# Patient Record
Sex: Female | Born: 1945
Health system: Southern US, Community
[De-identification: ages and names within clinical notes are randomized; demographics above are authoritative.]

## PROBLEM LIST (undated history)

## (undated) DIAGNOSIS — I10 Essential (primary) hypertension: Secondary | ICD-10-CM

## (undated) DIAGNOSIS — M810 Age-related osteoporosis without current pathological fracture: Secondary | ICD-10-CM

## (undated) DIAGNOSIS — M069 Rheumatoid arthritis, unspecified: Secondary | ICD-10-CM

## (undated) HISTORY — PX: BREAST SURGERY: SHX581

## (undated) HISTORY — PX: LAPAROSCOPY FOR ECTOPIC PREGNANCY: SUR765

---

## 2019-01-21 ENCOUNTER — Other Ambulatory Visit: Payer: Self-pay

## 2019-01-21 ENCOUNTER — Inpatient Hospital Stay (HOSPITAL_BASED_OUTPATIENT_CLINIC_OR_DEPARTMENT_OTHER)
Admission: EM | Admit: 2019-01-21 | Discharge: 2019-01-26 | DRG: 521 | Disposition: A | Discharge status: Home Health | Payer: Medicare Other | Attending: Internal Medicine | Admitting: Internal Medicine

## 2019-01-21 ENCOUNTER — Emergency Department (HOSPITAL_BASED_OUTPATIENT_CLINIC_OR_DEPARTMENT_OTHER): Payer: Medicare Other

## 2019-01-21 ENCOUNTER — Encounter (HOSPITAL_BASED_OUTPATIENT_CLINIC_OR_DEPARTMENT_OTHER): Payer: Self-pay | Admitting: Emergency Medicine

## 2019-01-21 DIAGNOSIS — Z806 Family history of leukemia: Secondary | ICD-10-CM | POA: Diagnosis not present

## 2019-01-21 DIAGNOSIS — Z87891 Personal history of nicotine dependence: Secondary | ICD-10-CM | POA: Diagnosis not present

## 2019-01-21 DIAGNOSIS — Z978 Presence of other specified devices: Secondary | ICD-10-CM

## 2019-01-21 DIAGNOSIS — K08409 Partial loss of teeth, unspecified cause, unspecified class: Secondary | ICD-10-CM | POA: Diagnosis present

## 2019-01-21 DIAGNOSIS — J9601 Acute respiratory failure with hypoxia: Secondary | ICD-10-CM | POA: Diagnosis not present

## 2019-01-21 DIAGNOSIS — R600 Localized edema: Secondary | ICD-10-CM | POA: Diagnosis present

## 2019-01-21 DIAGNOSIS — Z803 Family history of malignant neoplasm of breast: Secondary | ICD-10-CM

## 2019-01-21 DIAGNOSIS — S72002A Fracture of unspecified part of neck of left femur, initial encounter for closed fracture: Secondary | ICD-10-CM

## 2019-01-21 DIAGNOSIS — E785 Hyperlipidemia, unspecified: Secondary | ICD-10-CM | POA: Diagnosis present

## 2019-01-21 DIAGNOSIS — M810 Age-related osteoporosis without current pathological fracture: Secondary | ICD-10-CM | POA: Diagnosis present

## 2019-01-21 DIAGNOSIS — I10 Essential (primary) hypertension: Secondary | ICD-10-CM | POA: Diagnosis present

## 2019-01-21 DIAGNOSIS — Z853 Personal history of malignant neoplasm of breast: Secondary | ICD-10-CM

## 2019-01-21 DIAGNOSIS — Z20828 Contact with and (suspected) exposure to other viral communicable diseases: Secondary | ICD-10-CM | POA: Diagnosis present

## 2019-01-21 DIAGNOSIS — M069 Rheumatoid arthritis, unspecified: Secondary | ICD-10-CM | POA: Diagnosis present

## 2019-01-21 DIAGNOSIS — K0889 Other specified disorders of teeth and supporting structures: Secondary | ICD-10-CM | POA: Diagnosis present

## 2019-01-21 DIAGNOSIS — M25552 Pain in left hip: Secondary | ICD-10-CM | POA: Diagnosis present

## 2019-01-21 DIAGNOSIS — Z9889 Other specified postprocedural states: Secondary | ICD-10-CM

## 2019-01-21 DIAGNOSIS — M8000XA Age-related osteoporosis with current pathological fracture, unspecified site, initial encounter for fracture: Secondary | ICD-10-CM | POA: Diagnosis not present

## 2019-01-21 DIAGNOSIS — I700 Atherosclerosis of aorta: Secondary | ICD-10-CM | POA: Diagnosis present

## 2019-01-21 DIAGNOSIS — I2699 Other pulmonary embolism without acute cor pulmonale: Secondary | ICD-10-CM | POA: Diagnosis present

## 2019-01-21 DIAGNOSIS — I493 Ventricular premature depolarization: Secondary | ICD-10-CM | POA: Diagnosis present

## 2019-01-21 DIAGNOSIS — D62 Acute posthemorrhagic anemia: Secondary | ICD-10-CM | POA: Diagnosis not present

## 2019-01-21 DIAGNOSIS — B91 Sequelae of poliomyelitis: Secondary | ICD-10-CM | POA: Diagnosis not present

## 2019-01-21 DIAGNOSIS — Z01818 Encounter for other preprocedural examination: Secondary | ICD-10-CM

## 2019-01-21 DIAGNOSIS — W010XXA Fall on same level from slipping, tripping and stumbling without subsequent striking against object, initial encounter: Secondary | ICD-10-CM | POA: Diagnosis present

## 2019-01-21 DIAGNOSIS — M6281 Muscle weakness (generalized): Secondary | ICD-10-CM | POA: Diagnosis present

## 2019-01-21 DIAGNOSIS — R609 Edema, unspecified: Secondary | ICD-10-CM | POA: Diagnosis not present

## 2019-01-21 DIAGNOSIS — S72012A Unspecified intracapsular fracture of left femur, initial encounter for closed fracture: Principal | ICD-10-CM

## 2019-01-21 DIAGNOSIS — E876 Hypokalemia: Secondary | ICD-10-CM | POA: Diagnosis present

## 2019-01-21 DIAGNOSIS — W19XXXA Unspecified fall, initial encounter: Secondary | ICD-10-CM | POA: Diagnosis not present

## 2019-01-21 DIAGNOSIS — I2609 Other pulmonary embolism with acute cor pulmonale: Secondary | ICD-10-CM | POA: Diagnosis not present

## 2019-01-21 DIAGNOSIS — S72009A Fracture of unspecified part of neck of unspecified femur, initial encounter for closed fracture: Secondary | ICD-10-CM | POA: Diagnosis present

## 2019-01-21 DIAGNOSIS — Y92009 Unspecified place in unspecified non-institutional (private) residence as the place of occurrence of the external cause: Secondary | ICD-10-CM | POA: Diagnosis not present

## 2019-01-21 DIAGNOSIS — Z8249 Family history of ischemic heart disease and other diseases of the circulatory system: Secondary | ICD-10-CM

## 2019-01-21 DIAGNOSIS — Z86718 Personal history of other venous thrombosis and embolism: Secondary | ICD-10-CM

## 2019-01-21 HISTORY — DX: Rheumatoid arthritis, unspecified: M06.9

## 2019-01-21 HISTORY — DX: Essential (primary) hypertension: I10

## 2019-01-21 HISTORY — DX: Age-related osteoporosis without current pathological fracture: M81.0

## 2019-01-21 LAB — CBC WITH DIFFERENTIAL/PLATELET
Abs Immature Granulocytes: 0.05 10*3/uL (ref 0.00–0.07)
Basophils Absolute: 0.1 10*3/uL (ref 0.0–0.1)
Basophils Relative: 1 %
Eosinophils Absolute: 0.1 10*3/uL (ref 0.0–0.5)
Eosinophils Relative: 1 %
HCT: 35.6 % — ABNORMAL LOW (ref 36.0–46.0)
Hemoglobin: 12 g/dL (ref 12.0–15.0)
Immature Granulocytes: 1 %
Lymphocytes Relative: 9 %
Lymphs Abs: 0.8 10*3/uL (ref 0.7–4.0)
MCH: 28.4 pg (ref 26.0–34.0)
MCHC: 33.7 g/dL (ref 30.0–36.0)
MCV: 84.2 fL (ref 80.0–100.0)
Monocytes Absolute: 0.8 10*3/uL (ref 0.1–1.0)
Monocytes Relative: 9 %
Neutro Abs: 7.1 10*3/uL (ref 1.7–7.7)
Neutrophils Relative %: 79 %
Platelets: 254 10*3/uL (ref 150–400)
RBC: 4.23 MIL/uL (ref 3.87–5.11)
RDW: 13.2 % (ref 11.5–15.5)
WBC: 8.9 10*3/uL (ref 4.0–10.5)
nRBC: 0 % (ref 0.0–0.2)

## 2019-01-21 LAB — BASIC METABOLIC PANEL
Anion gap: 11 (ref 5–15)
Anion gap: 12 (ref 5–15)
BUN: 24 mg/dL — ABNORMAL HIGH (ref 8–23)
BUN: 20 mg/dL (ref 8–23)
CO2: 28 mmol/L (ref 22–32)
CO2: 27 mmol/L (ref 22–32)
Calcium: 9.2 mg/dL (ref 8.9–10.3)
Calcium: 9 mg/dL (ref 8.9–10.3)
Chloride: 97 mmol/L — ABNORMAL LOW (ref 98–111)
Chloride: 99 mmol/L (ref 98–111)
Creatinine, Ser: 0.78 mg/dL (ref 0.44–1.00)
Creatinine, Ser: 0.7 mg/dL (ref 0.44–1.00)
GFR calc Af Amer: >60 mL/min (ref >60)
GFR calc Af Amer: >60 mL/min (ref >60)
GFR calc non Af Amer: >60 mL/min (ref >60)
GFR calc non Af Amer: >60 mL/min (ref >60)
Glucose, Bld: 102 mg/dL — ABNORMAL HIGH (ref 70–99)
Glucose, Bld: 121 mg/dL — ABNORMAL HIGH (ref 70–99)
Potassium: 2.7 mmol/L — CL (ref 3.5–5.1)
Potassium: 3.3 mmol/L — ABNORMAL LOW (ref 3.5–5.1)
Sodium: 136 mmol/L (ref 135–145)
Sodium: 138 mmol/L (ref 135–145)

## 2019-01-21 LAB — PROTIME-INR
INR: 1 (ref 0.8–1.2)
Prothrombin Time: 12.7 seconds (ref 11.4–15.2)

## 2019-01-21 LAB — MRSA PCR SCREENING: MRSA by PCR: NEGATIVE

## 2019-01-21 LAB — SARS CORONAVIRUS 2 (TAT 6-24 HRS): SARS Coronavirus 2: NEGATIVE

## 2019-01-21 LAB — MAGNESIUM: Magnesium: 1.9 mg/dL (ref 1.7–2.4)

## 2019-01-21 MED ORDER — ONDANSETRON HCL 4 MG/2ML IJ SOLN
4.0000 mg | Freq: Four times a day (QID) | INTRAMUSCULAR | Status: DC | PRN
  Administered 2019-01-21: 4 mg via INTRAVENOUS
  Filled 2019-01-21: qty 2

## 2019-01-21 MED ORDER — CHLORHEXIDINE GLUCONATE 4 % EX LIQD: 60.0000 mL | Freq: Once | CUTANEOUS | Status: DC

## 2019-01-21 MED ORDER — POTASSIUM CHLORIDE CRYS ER 20 MEQ PO TBCR
40.0000 meq | EXTENDED_RELEASE_TABLET | Freq: Once | ORAL | Status: AC
  Administered 2019-01-21: 40 meq via ORAL
  Filled 2019-01-21: qty 2

## 2019-01-21 MED ORDER — METHOCARBAMOL 500 MG PO TABS: 500.0000 mg | ORAL_TABLET | Freq: Four times a day (QID) | ORAL | Status: DC | PRN

## 2019-01-21 MED ORDER — SODIUM CHLORIDE 0.9 % IV SOLN
INTRAVENOUS | Status: DC | PRN
  Administered 2019-01-21: 1000 mL via INTRAVENOUS

## 2019-01-21 MED ORDER — ONDANSETRON HCL 4 MG/2ML IJ SOLN
4.0000 mg | Freq: Once | INTRAMUSCULAR | Status: AC
  Administered 2019-01-21: 4 mg via INTRAVENOUS
  Filled 2019-01-21: qty 2

## 2019-01-21 MED ORDER — HYDROMORPHONE HCL 1 MG/ML IJ SOLN
0.5000 mg | INTRAMUSCULAR | Status: DC | PRN
  Administered 2019-01-21: 0.5 mg via INTRAVENOUS
  Filled 2019-01-21: qty 0.5

## 2019-01-21 MED ORDER — ENSURE PRE-SURGERY PO LIQD
296.0000 mL | Freq: Once | ORAL | Status: AC
  Administered 2019-01-22: 296 mL via ORAL
  Filled 2019-01-21: qty 296

## 2019-01-21 MED ORDER — POVIDONE-IODINE 10 % EX SWAB: 2.0000 "application " | Freq: Once | CUTANEOUS | Status: DC

## 2019-01-21 MED ORDER — CEFAZOLIN SODIUM-DEXTROSE 2-4 GM/100ML-% IV SOLN
2.0000 g | INTRAVENOUS | Status: AC
  Administered 2019-01-22: 11:00:00 2 g via INTRAVENOUS

## 2019-01-21 MED ORDER — LISINOPRIL 20 MG PO TABS: 20.0000 mg | ORAL_TABLET | Freq: Every day | ORAL | Status: DC

## 2019-01-21 MED ORDER — HYDROMORPHONE HCL 1 MG/ML IJ SOLN: 0.5000 mg | INTRAMUSCULAR | Status: DC | PRN

## 2019-01-21 MED ORDER — TRAMADOL HCL 50 MG PO TABS
50.0000 mg | ORAL_TABLET | Freq: Four times a day (QID) | ORAL | Status: DC | PRN
  Administered 2019-01-22: 50 mg via ORAL
  Filled 2019-01-21: qty 1

## 2019-01-21 MED ORDER — HYDROCODONE-ACETAMINOPHEN 5-325 MG PO TABS: 1.0000 | ORAL_TABLET | Freq: Four times a day (QID) | ORAL | Status: DC | PRN

## 2019-01-21 MED ORDER — ONDANSETRON HCL 4 MG PO TABS: 4.0000 mg | ORAL_TABLET | Freq: Four times a day (QID) | ORAL | Status: DC | PRN

## 2019-01-21 MED ORDER — POTASSIUM CHLORIDE 10 MEQ/100ML IV SOLN
10.0000 meq | INTRAVENOUS | Status: AC
  Administered 2019-01-21 (×2): 10 meq via INTRAVENOUS
  Filled 2019-01-21 (×2): qty 100

## 2019-01-21 MED ORDER — METHOCARBAMOL 1000 MG/10ML IJ SOLN: 500.0000 mg | Freq: Four times a day (QID) | INTRAVENOUS | Status: DC | PRN

## 2019-01-21 MED ORDER — HYDROCODONE-ACETAMINOPHEN 5-325 MG PO TABS
1.0000 | ORAL_TABLET | Freq: Once | ORAL | Status: AC
  Administered 2019-01-21: 1 via ORAL
  Filled 2019-01-21: qty 1

## 2019-01-21 MED ORDER — POTASSIUM CHLORIDE CRYS ER 20 MEQ PO TBCR
60.0000 meq | EXTENDED_RELEASE_TABLET | Freq: Once | ORAL | Status: AC
  Administered 2019-01-21: 60 meq via ORAL
  Filled 2019-01-21: qty 3

## 2019-01-21 NOTE — ED Notes (Signed)
[place], PA made aware of pt's O2 sats (see VS record). 2L [place] applied

## 2019-01-21 NOTE — ED Notes (Signed)
Spoke with Catherine Wong, consult for ortho

## 2019-01-21 NOTE — ED Provider Notes (Signed)
[facility] EMERGENCY DEPARTMENT Provider Note   CSN: [id number] Arrival date & time: 01/21/19  1131     History   Chief Complaint Chief Complaint  Patient presents with  . Fall    HPI Catherine Wong is a 73 y.o. female.     Patient presents the emergency department with ongoing left-sided hip pain.  Patient has a history of poliomyelitis and has had paralysis since age 89.  She is typically able to ambulate with crutches and a brace on her left leg.  She has had a history of knee surgery and now has a rod in her right leg.  She states that she fell about 5 days ago when she lost her balance.  She states that she was very tired and just fell over.  She denies hitting her head.  She has had significant pain with movement over the past 5 days and has been using a wheelchair.  She denies other injuries or pains elsewhere.  She has been taking over-the-counter medication without improvement.  She presents today with persistent pain.     Past Medical History:  Diagnosis Date  . Hypertension   . Osteoporosis   . Rheumatoid arthritis (HCC)     There are no active problems to display for this patient.   The histories are not reviewed yet. Please review them in the "History" navigator section and refresh this SmartLink.   OB History   No obstetric history on file.      Home Medications    Prior to Admission medications   Not on File    Family History History reviewed. No pertinent family history.  Social History Social History   Tobacco Use  . Smoking status: Current Every Day Smoker  Substance Use Topics  . Alcohol use: Never    Frequency: Never  . Drug use: Never     Allergies   Patient has no allergy information on record.   Review of Systems Review of Systems  Constitutional: Negative for fever and unexpected weight change.  HENT: Negative for facial swelling, rhinorrhea, sore throat and trouble swallowing.   Eyes: Negative for redness.   Respiratory: Negative for cough, shortness of breath, wheezing and stridor.   Cardiovascular: Negative for chest pain.  Gastrointestinal: Negative for abdominal pain, constipation, diarrhea, nausea and vomiting.       Negative for fecal incontinence.   Genitourinary: Negative for dysuria, flank pain, hematuria, pelvic pain, vaginal bleeding and vaginal discharge.       Negative for urinary incontinence or retention.  Musculoskeletal: Positive for arthralgias, back pain, gait problem and myalgias.  Skin: Negative for rash.  Neurological: Negative for weakness, light-headedness, numbness and headaches.       Denies saddle paresthesias.  Psychiatric/Behavioral: Negative for confusion.     Physical Exam Updated Vital Signs BP (!) 165/85 (BP Location: Right Arm)   Pulse 80   Temp 98.2 F (36.8 C) (Oral)   Resp 18   Ht 5\' 3"  (1.6 m)   Wt 78 kg   SpO2 98%   BMI 30.46 kg/m   Physical Exam Vitals signs and nursing note reviewed.  Constitutional:      Appearance: She is well-developed.  HENT:     Head: Normocephalic and atraumatic.  Eyes:     General:        Right eye: No discharge.        Left eye: No discharge.     Conjunctiva/sclera: Conjunctivae normal.  Neck:  Musculoskeletal: Normal range of motion and neck supple.  Cardiovascular:     Rate and Rhythm: Normal rate and regular rhythm.     Pulses:          Radial pulses are 2+ on the right side and 2+ on the left side.       Dorsalis pedis pulses are 2+ on the right side and 2+ on the left side.     Heart sounds: Normal heart sounds.  Pulmonary:     Effort: Pulmonary effort is normal.     Breath sounds: Normal breath sounds.  Abdominal:     Palpations: Abdomen is soft.     Tenderness: There is no abdominal tenderness.  Musculoskeletal:     Left hip: She exhibits tenderness.     Cervical back: Normal.     Thoracic back: Normal.     Lumbar back: Normal.     Comments: Patient with brace on left leg.  She does not  have sensation.  Patient complains of pain, worse with palpation and movement over the lateral left hip that radiates to the groin area.  Patient winces with palpation in these areas.  Skin:    General: Skin is warm and dry.  Neurological:     Mental Status: She is alert.      ED Treatments / Results  Labs (all labs ordered are listed, but only abnormal results are displayed) Labs Reviewed  BASIC METABOLIC PANEL - Abnormal; Notable for the following components:      Result Value   Potassium 2.7 (*)    Chloride 97 (*)    Glucose, Bld 102 (*)    BUN 24 (*)    All other components within normal limits  CBC WITH DIFFERENTIAL/PLATELET - Abnormal; Notable for the following components:   HCT 35.6 (*)    All other components within normal limits  SARS CORONAVIRUS 2 (TAT 6-24 HRS)  PROTIME-INR  TYPE AND SCREEN    EKG EKG Interpretation  Date/Time:  Saturday January 21 2019 12:50:23 EST Ventricular Rate:  67 PR Interval:    QRS Duration: 99 QT Interval:  412 QTC Calculation: 435 R Axis:   -35 Text Interpretation: Sinus rhythm Ventricular premature complex Left axis deviation Borderline low voltage, extremity leads Baseline wander in lead(s) V2 No STEMI Confirmed by Alvester Chou [phone]) on 01/21/2019 12:55:57 PM   Radiology Dg Hip Unilat W Or Wo Pelvis 2-3 Views Left  Result Date: 01/21/2019 CLINICAL DATA:  Left hip pain, status post fall EXAM: DG HIP (WITH OR WITHOUT PELVIS) 2-3V LEFT COMPARISON:  None. FINDINGS: Nondisplaced subcapital left hip fracture. Status post ORIF of the right hip. Visualized bony pelvis appears intact. Degenerative changes of the lower lumbar spine. IMPRESSION: Nondisplaced subcapital left hip fracture. Electronically Signed   By: Charline Bills M.D.   On: 01/21/2019 12:18    Procedures Procedures (including critical care time)  Medications Ordered in ED Medications  HYDROmorphone (DILAUDID) injection 0.5 mg (has no administration in time  range)  potassium chloride SA (KLOR-CON) CR tablet 60 mEq (has no administration in time range)  potassium chloride 10 mEq in 100 mL IVPB (has no administration in time range)  HYDROcodone-acetaminophen (NORCO/VICODIN) 5-325 MG per tablet 1 tablet (1 tablet Oral Given 01/21/19 1217)  ondansetron (ZOFRAN) injection 4 mg (4 mg Intravenous Given 01/21/19 1300)     Initial Impression / Assessment and Plan / ED Course  I have reviewed the triage vital signs and the nursing notes.  Pertinent labs &  imaging results that were available during my care of the patient were reviewed by me and considered in my medical decision making (see chart for details).  Clinical Course as of Jan 20 1238  Sat Jan 21, 2019  1231 Patient seen by myself as well as PA provider.  Briefly this is a 73 year old female with a history of polio as a child, with chronic left-sided leg paralysis using a hip brace, presenting to the ED with a mechanical fall.  Patient was found to have a left subcapital nondisplaced hip fracture.  She has minimal pain but has minimal sensation on the left side at baseline.  She has no strength in her left leg.  She is otherwise well-appearing and has no complaints my evaluation.  She is pending admission labs, will reach out to orthopedics as well.   [MT]    Clinical Course User Index [MT] Trifan, Carola Rhine, MD       Patient seen and examined. Medications ordered.   Vital signs reviewed and are as follows: BP (!) 165/85 (BP Location: Right Arm)   Pulse 80   Temp 98.2 F (36.8 C) (Oral)   Resp 18   Ht 5\' 3"  (1.6 m)   Wt 78 kg   SpO2 98%   BMI 30.46 kg/m   12:43 PM patient updated on results.  I reviewed x-rays.  Patient with left-sided subcapital nondisplaced hip fracture that is closed.  Lower extremity is vascularly intact.  Patient discussed with and seen by Dr. Langston Masker. Patient aware of need for admission.  We will touch base with orthopedic surgery.  1:19 PM Awaiting remainder of  labs. Spoke with Dr. Lorin Mercy of orthopedic surgery. Plan fixation tomorrow.   1:58 PM I spoke with Dr. Zigmund Daniel of [facility] who agrees to admission.   BMP has returned with K 2.7. Oral K+ and two runs IV K+ ordered to replete.   RN states that O2 level upper 80's after Vicodin ordered. She was placed on 2L [place]. Will continue to monitor.   Final Clinical Impressions(s) / ED Diagnoses   Final diagnoses:  Closed subcapital fracture of left femur, initial encounter ([place])  Hypokalemia   Hip fx. K+ being repleted. Needs admit.   ED Discharge Orders    None       Carlisle Cater, PA-C 01/21/19 1400    Wyvonnia Dusky, MD 01/22/19 [phone]

## 2019-01-21 NOTE — ED Notes (Signed)
ED Provider at bedside. 

## 2019-01-21 NOTE — ED Notes (Signed)
Pt given PB and graham crackers and apple juice

## 2019-01-21 NOTE — ED Notes (Signed)
Pt's son took her clothing and leg brace

## 2019-01-21 NOTE — ED Notes (Signed)
Pt may eat per [place], PA

## 2019-01-21 NOTE — Anesthesia Preprocedure Evaluation (Addendum)
Anesthesia Evaluation  Patient identified by MRN, date of birth, ID band Patient awake    Reviewed: Allergy & Precautions, NPO status , Patient's Chart, lab work & pertinent test results  History of Anesthesia Complications Negative for: history of anesthetic complications  Airway Mallampati: II  TM Distance: >3 FB Neck ROM: Full    Dental  (+) Edentulous Upper, Missing,    Pulmonary Current Smoker,    Pulmonary exam normal        Cardiovascular hypertension, Pt. on medications Normal cardiovascular exam     Neuro/Psych poliomyelitis as child with left leg chronic weakness negative psych ROS   GI/Hepatic negative GI ROS, Neg liver ROS,   Endo/Other  negative endocrine ROS  Renal/GU negative Renal ROS  negative genitourinary   Musculoskeletal  (+) Arthritis , Rheumatoid disorders,    Abdominal   Peds  Hematology negative hematology ROS (+)   Anesthesia Other Findings Day of surgery medications reviewed with patient.  Reproductive/Obstetrics negative OB ROS                           Anesthesia Physical Anesthesia Plan  ASA: II  Anesthesia Plan: General   Post-op Pain Management:    Induction: Intravenous  PONV Risk Score and Plan: 4 or greater and Treatment may vary due to age or medical condition, Ondansetron and Dexamethasone  Airway Management Planned: Oral ETT  Additional Equipment: None  Intra-op Plan:   Post-operative Plan: Extubation in OR  Informed Consent: I have reviewed the patients History and Physical, chart, labs and discussed the procedure including the risks, benefits and alternatives for the proposed anesthesia with the patient or authorized representative who has indicated his/her understanding and acceptance.     Dental advisory given  Plan Discussed with: CRNA  Anesthesia Plan Comments: (Offered spinal vs GETA. Pt would like to proceed with GETA.  Daiva Huge, MD)      Anesthesia Quick Evaluation

## 2019-01-21 NOTE — Plan of Care (Signed)
73 year old female coming from [place] with history of polio status post fall on her left hip and fractured left hip.  Dr. Lorin Mercy has been consulted and he is going to take her to the OR tomorrow.  Patient will be n.p.o. after midnight.  Lovenox will be started after surgery for DVT prophylaxis.

## 2019-01-21 NOTE — ED Triage Notes (Signed)
Pt c/o left side hip pain r/t fall x 5 days ago. Pt denies loc, reports landing on hip. Pt reports hx of polio. Pt states 800 mg ibuprofen at 0600 today.

## 2019-01-21 NOTE — H&P (Signed)
Catherine SeedsKathleen Wong ZOX:[id number] DOB: 12/17/1945 DOA: 01/21/2019     PCP: Patient, No Pcp Per   Outpatient Specialists:   none  Patient arrived to ER on 01/21/19 at 1131  Patient coming from: home Lives  With family    Chief Complaint:  Chief Complaint  Patient presents with   Fall    HPI: Catherine Wong is a 73 y.o. female with medical history significant of poliomyelitis as child with left leg chronic weakness, hypertension osteoporosis rheumatoid arthritis, hx of breast cancer    Presented with  Fall 5 days ago while in [place] with  left side hip pain  No LOC, hx of polio as a child.  She has been walking with a brace ever since she was 73 years old. Reports a mechanical fall when she lost her balance.  Denies hitting her head.  Since that she has been having significant pain and been using wheelchair.  No other pain anywhere else.  She has been trying to use ibuprofen to help with her pain but it persisted so [facility].  Patient recently moved from [place] with her family for the past 5 days They traveled by car  Denis any hx of CAD She denies shortness of breath or chest pain with walking  she quit smoking 2 years   Infectious risk factors:  Reports N/V  No fever or chills She has been isolating       in house  PCR testing  Pending  No results found for: SARSCOV2NAA   Regarding pertinent Chronic problems: Department   Hyperlipidemia - on statins simvastatin not compliant   HTN on lisinopril, HCTZ    Breast cancer in Left breast in remission  19 years ago  RA - only on ibuprofen   While in ER: X-ray showed subcapital nondisplaced hip fracture Was found to have potassium of 2.7 Her potassium was replaced and her pain was treated with IV Dilaudid  ER Provider Called: Orthopedic surgery   Dr.Yates They Recommend admit to medicine plan to repair hip in a.m. Will see in AM requesting repeat keep patient n.p.o. post  midnight Plan for Lovenox to be started after surgery for DVT prophylaxis  While in emergency department patient became somewhat hypoxic after Vicodin was administered and was started on 2 L  The following Work up has been ordered so far:  Orders Placed This Encounter  Procedures   MRSA PCR Screening   DG Hip Unilat W or Wo Pelvis 2-3 Views Left   DG Chest Port 1 View   Basic metabolic panel   CBC WITH DIFFERENTIAL   Protime-INR   Diet regular Room service appropriate? Yes; Fluid consistency: Thin   Diet NPO time specified   Check CMS   Bed rest   Initiate Carrier Fluid Protocol   Vital signs   Notify physician   Initiate Oral Care Protocol   Initiate Carrier Fluid Protocol   RN may order General Admission PRN Orders utilizing "General Admission PRN medications" (through manage orders) for the following patient needs: allergy symptoms (Claritin), cold sores (Carmex), cough (Robitussin DM), eye irritation (Liquifilm Tears), hemorrhoids (Tucks), indigestion (Maalox), minor skin irritation (Hydrocortisone Cream), muscle pain Romeo Apple(Ben Gay), nose irritation (saline nasal spray) and sore throat (Chloraseptic spray).   SCDs   Full code   Consult to orthopedic surgery  ALL PATIENTS BEING ADMITTED/HAVING PROCEDURES NEED COVID-19 SCREENING   Inpatient consult to Social Work   Consult to care management  ED EKG   EKG 12-Lead   Admit to Inpatient (patient's expected length of stay will be greater than 2 midnights or inpatient only procedure)    Following Medications were ordered in ER: Medications  HYDROmorphone (DILAUDID) injection 0.5 mg (0.5 mg Intravenous Given 01/21/19 1651)  0.9 %  sodium chloride infusion ( Intravenous Stopped 01/21/19 1730)  ondansetron (ZOFRAN) injection 4 mg (4 mg Intravenous Given 01/21/19 1804)    Or  ondansetron (ZOFRAN) tablet 4 mg ( Oral See Alternative 01/21/19 1804)  HYDROcodone-acetaminophen (NORCO/VICODIN) 5-325 MG per tablet 1 tablet  (1 tablet Oral Given 01/21/19 1217)  ondansetron (ZOFRAN) injection 4 mg (4 mg Intravenous Given 01/21/19 1300)  potassium chloride SA (KLOR-CON) CR tablet 60 mEq (60 mEq Oral Given 01/21/19 1453)  potassium chloride 10 mEq in 100 mL IVPB (10 mEq Intravenous Other (enter comment in med admin window) 01/21/19 1728)        Consult Orders  (From admission, onward)         Start     Ordered   01/21/19 1827  Inpatient consult to Social Work  Once    Comments: Possible with admission  Provider:  (Not yet assigned)  Question:  Reason for Consult:  Answer:  Skilled nursing facility placement   01/21/19 1827   01/21/19 1827  Consult to care management  Once    Provider:  (Not yet assigned)  Question Answer Comment  Reason for consult: Home health needs   Reason for consult: Other (see comments)      01/21/19 1827           Significant initial  Findings: Abnormal Labs Reviewed  BASIC METABOLIC PANEL - Abnormal; Notable for the following components:      Result Value   Potassium 2.7 (*)    Chloride 97 (*)    Glucose, Bld 102 (*)    BUN 24 (*)    All other components within normal limits  CBC WITH DIFFERENTIAL/PLATELET - Abnormal; Notable for the following components:   HCT 35.6 (*)    All other components within normal limits    Otherwise labs showing:    Recent Labs  Lab 01/21/19 1246  NA 136  K 2.7*  CO2 28  GLUCOSE 102*  BUN 24*  CREATININE 0.78  CALCIUM 9.2    Cr   Stable  Lab Results  Component Value Date   CREATININE 0.78 01/21/2019    No results for input(s): AST, ALT, ALKPHOS, BILITOT, PROT, ALBUMIN in the last 168 hours. Lab Results  Component Value Date   CALCIUM 9.2 01/21/2019     WBC      Component Value Date/Time   WBC 8.9 01/21/2019 1246   ANC    Component Value Date/Time   NEUTROABS 7.1 01/21/2019 1246   ALC No components found for: LYMPHAB    Plt: Lab Results  Component Value Date   PLT 254 01/21/2019     COVID-19 Labs  No  results for input(s): DDIMER, FERRITIN, LDH, CRP in the last 72 hours.  No results found for: SARSCOV2NAA     HG/HCT  Stable     Component Value Date/Time   HGB 12.0 01/21/2019 1246   HCT 35.6 (L) 01/21/2019 1246       ECG: Ordered Personally reviewed by me showing: HR : 67 Rhythm:  NSR,     no evidence of ischemic changes QTC 435    UA   not ordered      Ordered  CXR -  NON acute  LEft hip  Nondisplaced subcapital left hip fracture.    ED Triage Vitals  Enc Vitals Group     BP 01/21/19 1145 (!) 165/85     Pulse Rate 01/21/19 1145 80     Resp 01/21/19 1145 18     Temp 01/21/19 1145 98.2 F (36.8 C)     Temp Source 01/21/19 1145 Oral     SpO2 01/21/19 1145 98 %     Weight 01/21/19 1146 171 lb 15.3 oz (78 kg)     Height 01/21/19 1146 5\' 3"  (1.6 m)     Head Circumference --      Peak Flow --      Pain Score 01/21/19 1146 10     Pain Loc --      Pain Edu? --      Excl. in GC? --   TMAX(24)@       Latest  Blood pressure 119/66, pulse (!) 47, temperature 97.7 F (36.5 C), temperature source Oral, resp. rate 16, height 5\' 3"  (1.6 m), weight 73.9 kg, SpO2 94 %.     Hospitalist was called for admission for hip fracture   Review of Systems:    Pertinent positives include:  nausea,  Constitutional:  No weight loss, night sweats, Fevers, chills, fatigue, weight loss  HEENT:  No headaches, Difficulty swallowing,Tooth/dental problems,Sore throat,  No sneezing, itching, ear ache, nasal congestion, post nasal drip,  Cardio-vascular:  No chest pain, Orthopnea, PND, anasarca, dizziness, palpitations.no Bilateral lower extremity swelling  GI:  No heartburn, indigestion, abdominal pain, vomiting, diarrhea, change in bowel habits, loss of appetite, melena, blood in stool, hematemesis Resp:  no shortness of breath at rest. No dyspnea on exertion, No excess mucus, no productive cough, No non-productive cough, No coughing up of blood.No change in color of mucus. No  wheezing. Skin:  no rash or lesions. No jaundice GU:  no dysuria, change in color of urine, no urgency or frequency. No straining to urinate.  No flank pain.  Musculoskeletal:  No joint pain or no joint swelling. No decreased range of motion. No back pain.  Psych:  No change in mood or affect. No depression or anxiety. No memory loss.  Neuro: no localizing neurological complaints, no tingling, no weakness, no double vision, no gait abnormality, no slurred speech, no confusion  All systems reviewed and apart from HOPI all are negative  Past Medical History:   Past Medical History:  Diagnosis Date   Hypertension    Osteoporosis    Rheumatoid arthritis (HCC)       Past Surgical History:  Procedure Laterality Date   BREAST SURGERY     LAPAROSCOPY FOR ECTOPIC PREGNANCY      Social History:  Ambulatory  With crutches     reports that she has been smoking. She does not have any smokeless tobacco history on file. She reports that she does not drink alcohol or use drugs.   Family History:   Family History  Problem Relation Age of Onset   Breast cancer Mother    CAD Mother    Leukemia Other     Allergies: No Known Allergies   Prior to Admission medications   Medication Sig Start Date End Date Taking? Authorizing Provider  hydrochlorothiazide (HYDRODIURIL) 25 MG tablet Take 25 mg by mouth daily.   Yes [provider]  ibuprofen (ADVIL) 200 MG tablet Take 600 mg by mouth every 6 (six) hours as needed for moderate pain.  Yes [provider]  lisinopril (ZESTRIL) 20 MG tablet Take 20 mg by mouth daily.   Yes [provider]  traMADol (ULTRAM) 50 MG tablet Take 50 mg by mouth every 6 (six) hours as needed for moderate pain.   Yes [provider]   Physical Exam: Blood pressure 119/66, pulse (!) 47, temperature 97.7 F (36.5 C), temperature source Oral, resp. rate 16, height 5\' 3"  (1.6 m), weight 73.9 kg, SpO2 94 %. 1. General:   in No  Acute distress    Chronically ill  -appearing 2. Psychological: Alert and  Oriented 3. Head/ENT:    Dry Mucous Membranes                          Head Non traumatic, neck supple                            Poor Dentition 4. SKIN:   decreased Skin turgor,  Skin clean Dry and intact no rash 5. Heart: Regular rate and rhythm no  Murmur, no Rub or gallop 6. Lungs:  no wheezes or crackles   7. Abdomen: Soft,  non-tender, Non distended  bowel sounds present 8. Lower extremities: no clubbing, cyanosis,  Edema on left foot chronic 9. Neurologically left leg weakness chronic  10. MSK: Normal range of motion   All other LABS:     Recent Labs  Lab 01/21/19 1246  WBC 8.9  NEUTROABS 7.1  HGB 12.0  HCT 35.6*  MCV 84.2  PLT 254     Recent Labs  Lab 01/21/19 1246  NA 136  K 2.7*  CL 97*  CO2 28  GLUCOSE 102*  BUN 24*  CREATININE 0.78  CALCIUM 9.2     No results for input(s): AST, ALT, ALKPHOS, BILITOT, PROT, ALBUMIN in the last 168 hours.     Cultures: No results found for: SDES, SPECREQUEST, CULT, REPTSTATUS   Radiological Exams on Admission: Dg Chest Port 1 View  Result Date: 01/21/2019 CLINICAL DATA:  Preop exam EXAM: PORTABLE CHEST 1 VIEW COMPARISON:  None FINDINGS: The heart size and mediastinal contours are within normal limits. Both lungs are clear. The visualized skeletal structures are unremarkable. IMPRESSION: No acute cardiopulmonary disease. Electronically Signed   By: 14/06/2018 M.D.   On: 01/21/2019 12:42   Dg Hip Unilat W Or Wo Pelvis 2-3 Views Left  Result Date: 01/21/2019 CLINICAL DATA:  Left hip pain, status post fall EXAM: DG HIP (WITH OR WITHOUT PELVIS) 2-3V LEFT COMPARISON:  None. FINDINGS: Nondisplaced subcapital left hip fracture. Status post ORIF of the right hip. Visualized bony pelvis appears intact. Degenerative changes of the lower lumbar spine. IMPRESSION: Nondisplaced subcapital left hip fracture. Electronically Signed   By: 14/06/2018 M.D.   On: 01/21/2019 12:18    Chart has been reviewed    Assessment/Plan  73 y.o. female with medical history significant of poliomyelitis as child with left leg chronic weakness, hypertension osteoporosis rheumatoid arthritis, hx of breast cancer     Admitted for left hip fracture   Present on Admission:    Hip fracture, left closed, initial encounter (HCC) -   management as per orthopedics,  plan to operate  in  a.m.    Keep nothing by mouth post midnight. Patient   not on anticoagulation or antiplatelet agents   Ordered type and screen, Place [place], order a vitamin D level  Patient  at baseline  able to walk a flight of stairs or 100 feet     Patient denies any chest pain or shortness of breath currently and/or with exertion,   ECG showing no evidence of acute ischemia  no known history of coronary artery disease, COPD  Liver failure CKD  Given advanced age patient is at least moderate  Risk  at this point no furthther cardiac workup is indicated.     Essential hypertension - chornic continue home meds Hold HCTZ for now   Osteoporosis - check vit D level   Hypokalemia - - will replace and repeat in AM,  check magnesium level and replace as needed. K now up to 3.3  Sp Polio with chronic left leg weakness and edema -would need to see assessment to discharge as patient may have additional challenges with relation. Leg edema although patient states this is chronic she has had recent extensive travel.  Obtain Dopplers of extremity   Other plan as per orders.  DVT prophylaxis:  SCD    Code Status:  FULL CODE  as per patient   I had personally discussed CODE STATUS with patient    Family Communication:   Family not at  Bedside  plan of care was discussed on the phone    Disposition Plan:    likely will need placement for rehabilitation                                         Would benefit from PT/OT eval prior to DC  pls order as per orthopedic when  cleared                                      Social Work  consulted                                       Consults called: Orthopedics Dr. Lorin Mercy is aware  Admission status:  ED Disposition    ED Disposition Condition [place]: [facility] [[id number]]  Level of Care: Med-Surg [16]  Covid Evaluation: Asymptomatic Screening Protocol (No Symptoms)  Diagnosis: [facility] [[id number]]  Admitting Physician: Georgette Shell [[id number]]  Attending Physician: Georgette Shell [[id number]]  Estimated length of stay: 3 - 4 days  Certification:: I certify this patient will need inpatient services for at least 2 midnights  PT Class (Do Not Modify): Inpatient [101]  PT Acc Code (Do Not Modify): Private [1]         inpatient     Expect 2 midnight stay secondary to severity of patient's current illness including     Severe lab/radiological/exam abnormalities including:  Left hip fracture   and extensive comorbidities including: polio hx of breast cancer    That are currently affecting medical management.   I expect  patient to be hospitalized for 2 midnights requiring inpatient medical care.  Patient is at high risk for adverse outcome (such as loss of life or disability) if not treated.  Indication for inpatient stay as follows:    severe pain requiring acute inpatient management,  inability to maintain oral hydration    Need for operative/procedural  intervention  Need for   IV fluids,      Level of care       medical floor      Precautions: admitted as  asymptomatic screening protocol  No active isolations    PPE: Used by the provider:   P100  eye Goggles,  Gloves      Krislynn Gronau 01/21/2019, 9:50 PM    [facility]     after 2 AM please page floor coverage PA If 7AM-7PM, please contact the day team taking care of the patient using [email or web address]

## 2019-01-21 NOTE — ED Notes (Signed)
Patient transported to X-ray 

## 2019-01-21 NOTE — Progress Notes (Signed)
Pt stable at this being medicated for nausea.

## 2019-01-21 NOTE — Plan of Care (Signed)
  Problem: Clinical Measurements: Goal: Respiratory complications will improve Outcome: Progressing   Problem: Clinical Measurements: Goal: Cardiovascular complication will be avoided Outcome: Progressing   Problem: Activity: Goal: Risk for activity intolerance will decrease Outcome: Progressing   Problem: Coping: Goal: Level of anxiety will decrease Outcome: Progressing   Problem: Pain Managment: Goal: General experience of comfort will improve Outcome: Progressing   Problem: Safety: Goal: Ability to remain free from injury will improve Outcome: Progressing   

## 2019-01-22 ENCOUNTER — Encounter (HOSPITAL_COMMUNITY)
Admission: EM | Disposition: A | Discharge status: Home Health | Payer: Self-pay | Source: Home / Self Care | Attending: Internal Medicine

## 2019-01-22 ENCOUNTER — Inpatient Hospital Stay (HOSPITAL_COMMUNITY): Payer: Medicare Other | Admitting: Anesthesiology

## 2019-01-22 ENCOUNTER — Encounter (HOSPITAL_COMMUNITY): Payer: Self-pay | Admitting: Certified Registered Nurse Anesthetist

## 2019-01-22 ENCOUNTER — Inpatient Hospital Stay (HOSPITAL_COMMUNITY): Payer: Medicare Other

## 2019-01-22 DIAGNOSIS — S72012A Unspecified intracapsular fracture of left femur, initial encounter for closed fracture: Secondary | ICD-10-CM | POA: Diagnosis not present

## 2019-01-22 DIAGNOSIS — R609 Edema, unspecified: Secondary | ICD-10-CM | POA: Diagnosis not present

## 2019-01-22 HISTORY — PX: HIP ARTHROPLASTY: SHX981

## 2019-01-22 LAB — BASIC METABOLIC PANEL
Anion gap: 10 (ref 5–15)
BUN: 19 mg/dL (ref 8–23)
CO2: 28 mmol/L (ref 22–32)
Calcium: 9.1 mg/dL (ref 8.9–10.3)
Chloride: 101 mmol/L (ref 98–111)
Creatinine, Ser: 0.69 mg/dL (ref 0.44–1.00)
GFR calc Af Amer: >60 mL/min (ref >60)
GFR calc non Af Amer: >60 mL/min (ref >60)
Glucose, Bld: 156 mg/dL — ABNORMAL HIGH (ref 70–99)
Potassium: 3.8 mmol/L (ref 3.5–5.1)
Sodium: 139 mmol/L (ref 135–145)

## 2019-01-22 LAB — CBC
HCT: 34.4 % — ABNORMAL LOW (ref 36.0–46.0)
Hemoglobin: 11.2 g/dL — ABNORMAL LOW (ref 12.0–15.0)
MCH: 28.5 pg (ref 26.0–34.0)
MCHC: 32.6 g/dL (ref 30.0–36.0)
MCV: 87.5 fL (ref 80.0–100.0)
Platelets: 240 10*3/uL (ref 150–400)
RBC: 3.93 MIL/uL (ref 3.87–5.11)
RDW: 13.2 % (ref 11.5–15.5)
WBC: 6.3 10*3/uL (ref 4.0–10.5)
nRBC: 0 % (ref 0.0–0.2)

## 2019-01-22 LAB — PREPARE RBC (CROSSMATCH)

## 2019-01-22 LAB — VITAMIN D 25 HYDROXY (VIT D DEFICIENCY, FRACTURES): Vit D, 25-Hydroxy: 34.94 ng/mL (ref 30–100)

## 2019-01-22 LAB — ALBUMIN: Albumin: 3.5 g/dL (ref 3.5–5.0)

## 2019-01-22 SURGERY — HEMIARTHROPLASTY, HIP, DIRECT ANTERIOR APPROACH, FOR FRACTURE
Anesthesia: General | Site: Hip | Laterality: Left

## 2019-01-22 MED ORDER — PHENYLEPHRINE 40 MCG/ML (10ML) SYRINGE FOR IV PUSH (FOR BLOOD PRESSURE SUPPORT)
PREFILLED_SYRINGE | INTRAVENOUS | Status: DC | PRN
  Administered 2019-01-22: 160 ug via INTRAVENOUS

## 2019-01-22 MED ORDER — FENTANYL CITRATE (PF) 100 MCG/2ML IJ SOLN
INTRAMUSCULAR | Status: AC
  Filled 2019-01-22: qty 2

## 2019-01-22 MED ORDER — DEXAMETHASONE SODIUM PHOSPHATE 10 MG/ML IJ SOLN
INTRAMUSCULAR | Status: AC
  Filled 2019-01-22: qty 1

## 2019-01-22 MED ORDER — LIDOCAINE 2% (20 MG/ML) 5 ML SYRINGE
INTRAMUSCULAR | Status: DC | PRN
  Administered 2019-01-22 (×2): 40 mg via INTRAVENOUS

## 2019-01-22 MED ORDER — ONDANSETRON HCL 4 MG PO TABS: 4.0000 mg | ORAL_TABLET | Freq: Four times a day (QID) | ORAL | Status: DC | PRN

## 2019-01-22 MED ORDER — ONDANSETRON HCL 4 MG/2ML IJ SOLN
INTRAMUSCULAR | Status: AC
  Filled 2019-01-22: qty 4

## 2019-01-22 MED ORDER — ACETAMINOPHEN 500 MG PO TABS
ORAL_TABLET | ORAL | Status: AC
  Administered 2019-01-22: 1000 mg
  Filled 2019-01-22: qty 2

## 2019-01-22 MED ORDER — ONDANSETRON HCL 4 MG/2ML IJ SOLN
INTRAMUSCULAR | Status: DC | PRN
  Administered 2019-01-22: 4 mg via INTRAVENOUS

## 2019-01-22 MED ORDER — BUPIVACAINE HCL (PF) 0.5 % IJ SOLN
INTRAMUSCULAR | Status: AC
  Filled 2019-01-22: qty 30

## 2019-01-22 MED ORDER — PHENYLEPHRINE 40 MCG/ML (10ML) SYRINGE FOR IV PUSH (FOR BLOOD PRESSURE SUPPORT)
PREFILLED_SYRINGE | INTRAVENOUS | Status: AC
  Filled 2019-01-22: qty 30

## 2019-01-22 MED ORDER — METHOCARBAMOL 500 MG IVPB - SIMPLE MED
INTRAVENOUS | Status: AC
  Filled 2019-01-22: qty 50

## 2019-01-22 MED ORDER — OXYCODONE HCL 5 MG PO TABS: 5.0000 mg | ORAL_TABLET | Freq: Once | ORAL | Status: DC | PRN

## 2019-01-22 MED ORDER — ACETAMINOPHEN 325 MG PO TABS: 325.0000 mg | ORAL_TABLET | Freq: Four times a day (QID) | ORAL | Status: DC | PRN

## 2019-01-22 MED ORDER — STERILE WATER FOR IRRIGATION IR SOLN
Status: DC | PRN
  Administered 2019-01-22: 2000 mL

## 2019-01-22 MED ORDER — HYDROCODONE-ACETAMINOPHEN 5-325 MG PO TABS
1.0000 | ORAL_TABLET | ORAL | Status: DC | PRN
  Administered 2019-01-22: 1 via ORAL
  Administered 2019-01-23: 2 via ORAL
  Administered 2019-01-23 – 2019-01-24 (×4): 1 via ORAL
  Filled 2019-01-22 (×4): qty 1
  Filled 2019-01-22: qty 2
  Filled 2019-01-22: qty 1

## 2019-01-22 MED ORDER — FENTANYL CITRATE (PF) 100 MCG/2ML IJ SOLN
25.0000 ug | INTRAMUSCULAR | Status: DC | PRN
  Administered 2019-01-22: 25 ug via INTRAVENOUS

## 2019-01-22 MED ORDER — ACETAMINOPHEN 10 MG/ML IV SOLN: 1000.0000 mg | Freq: Once | INTRAVENOUS | Status: DC | PRN

## 2019-01-22 MED ORDER — ACETAMINOPHEN 500 MG PO TABS
1000.0000 mg | ORAL_TABLET | Freq: Once | ORAL | Status: DC
  Filled 2019-01-22: qty 2

## 2019-01-22 MED ORDER — SUGAMMADEX SODIUM 500 MG/5ML IV SOLN
INTRAVENOUS | Status: AC
  Filled 2019-01-22: qty 5

## 2019-01-22 MED ORDER — SUCCINYLCHOLINE CHLORIDE 200 MG/10ML IV SOSY
PREFILLED_SYRINGE | INTRAVENOUS | Status: AC
  Filled 2019-01-22: qty 10

## 2019-01-22 MED ORDER — CEFAZOLIN SODIUM-DEXTROSE 2-4 GM/100ML-% IV SOLN
INTRAVENOUS | Status: AC
  Filled 2019-01-22: qty 100

## 2019-01-22 MED ORDER — PHENYLEPHRINE HCL (PRESSORS) 10 MG/ML IV SOLN
INTRAVENOUS | Status: AC
  Filled 2019-01-22: qty 1

## 2019-01-22 MED ORDER — PHENOL 1.4 % MT LIQD: 1.0000 | OROMUCOSAL | Status: DC | PRN

## 2019-01-22 MED ORDER — DOCUSATE SODIUM 100 MG PO CAPS
100.0000 mg | ORAL_CAPSULE | Freq: Two times a day (BID) | ORAL | Status: DC
  Administered 2019-01-23 – 2019-01-26 (×7): 100 mg via ORAL
  Filled 2019-01-22 (×8): qty 1

## 2019-01-22 MED ORDER — METHOCARBAMOL 500 MG IVPB - SIMPLE MED
500.0000 mg | Freq: Four times a day (QID) | INTRAVENOUS | Status: DC | PRN
  Administered 2019-01-22: 500 mg via INTRAVENOUS
  Filled 2019-01-22: qty 50

## 2019-01-22 MED ORDER — ASPIRIN EC 325 MG PO TBEC
325.0000 mg | DELAYED_RELEASE_TABLET | Freq: Every day | ORAL | Status: DC
  Administered 2019-01-23 – 2019-01-24 (×2): 325 mg via ORAL
  Filled 2019-01-22 (×2): qty 1

## 2019-01-22 MED ORDER — ALBUMIN HUMAN 5 % IV SOLN
INTRAVENOUS | Status: AC
  Filled 2019-01-22: qty 250

## 2019-01-22 MED ORDER — 0.9 % SODIUM CHLORIDE (POUR BTL) OPTIME
TOPICAL | Status: DC | PRN
  Administered 2019-01-22: 1000 mL

## 2019-01-22 MED ORDER — LIDOCAINE 2% (20 MG/ML) 5 ML SYRINGE
INTRAMUSCULAR | Status: AC
  Filled 2019-01-22: qty 5

## 2019-01-22 MED ORDER — ONDANSETRON HCL 4 MG/2ML IJ SOLN: 4.0000 mg | Freq: Four times a day (QID) | INTRAMUSCULAR | Status: DC | PRN

## 2019-01-22 MED ORDER — DEXAMETHASONE SODIUM PHOSPHATE 10 MG/ML IJ SOLN
INTRAMUSCULAR | Status: DC | PRN
  Administered 2019-01-22: 4 mg via INTRAVENOUS

## 2019-01-22 MED ORDER — SUGAMMADEX SODIUM 500 MG/5ML IV SOLN
INTRAVENOUS | Status: DC | PRN
  Administered 2019-01-22: 300 mg via INTRAVENOUS

## 2019-01-22 MED ORDER — METHOCARBAMOL 500 MG PO TABS
500.0000 mg | ORAL_TABLET | Freq: Four times a day (QID) | ORAL | Status: DC | PRN
  Administered 2019-01-23 – 2019-01-25 (×6): 500 mg via ORAL
  Filled 2019-01-22 (×6): qty 1

## 2019-01-22 MED ORDER — EPHEDRINE SULFATE-NACL 50-0.9 MG/10ML-% IV SOSY
PREFILLED_SYRINGE | INTRAVENOUS | Status: DC | PRN
  Administered 2019-01-22: 10 mg via INTRAVENOUS

## 2019-01-22 MED ORDER — HYDROCODONE-ACETAMINOPHEN 7.5-325 MG PO TABS
1.0000 | ORAL_TABLET | ORAL | Status: DC | PRN
  Administered 2019-01-24: 1 via ORAL
  Administered 2019-01-24 – 2019-01-25 (×3): 2 via ORAL
  Administered 2019-01-26: 1 via ORAL
  Filled 2019-01-22: qty 2
  Filled 2019-01-22: qty 1
  Filled 2019-01-22 (×3): qty 2

## 2019-01-22 MED ORDER — ROCURONIUM BROMIDE 10 MG/ML (PF) SYRINGE
PREFILLED_SYRINGE | INTRAVENOUS | Status: AC
  Filled 2019-01-22: qty 10

## 2019-01-22 MED ORDER — OXYCODONE HCL 5 MG/5ML PO SOLN: 5.0000 mg | Freq: Once | ORAL | Status: DC | PRN

## 2019-01-22 MED ORDER — FENTANYL CITRATE (PF) 250 MCG/5ML IJ SOLN
INTRAMUSCULAR | Status: DC | PRN
  Administered 2019-01-22: 100 ug via INTRAVENOUS
  Administered 2019-01-22 (×2): 50 ug via INTRAVENOUS

## 2019-01-22 MED ORDER — EPHEDRINE 5 MG/ML INJ
INTRAVENOUS | Status: AC
  Filled 2019-01-22: qty 20

## 2019-01-22 MED ORDER — PROPOFOL 10 MG/ML IV BOLUS
INTRAVENOUS | Status: AC
  Filled 2019-01-22: qty 20

## 2019-01-22 MED ORDER — TRAMADOL HCL 50 MG PO TABS
50.0000 mg | ORAL_TABLET | Freq: Four times a day (QID) | ORAL | Status: DC
  Administered 2019-01-22 – 2019-01-23 (×5): 50 mg via ORAL
  Filled 2019-01-22 (×7): qty 1

## 2019-01-22 MED ORDER — SODIUM CHLORIDE 0.9% IV SOLUTION: Freq: Once | INTRAVENOUS | Status: DC

## 2019-01-22 MED ORDER — ONDANSETRON HCL 4 MG/2ML IJ SOLN
INTRAMUSCULAR | Status: AC
  Filled 2019-01-22: qty 2

## 2019-01-22 MED ORDER — PROMETHAZINE HCL 25 MG/ML IJ SOLN: 6.2500 mg | INTRAMUSCULAR | Status: DC | PRN

## 2019-01-22 MED ORDER — EPHEDRINE 5 MG/ML INJ
INTRAVENOUS | Status: AC
  Filled 2019-01-22: qty 10

## 2019-01-22 MED ORDER — SODIUM CHLORIDE 0.45 % IV SOLN
INTRAVENOUS | Status: DC
  Administered 2019-01-22 – 2019-01-23 (×3): via INTRAVENOUS

## 2019-01-22 MED ORDER — MORPHINE SULFATE (PF) 2 MG/ML IV SOLN: 0.5000 mg | INTRAVENOUS | Status: DC | PRN

## 2019-01-22 MED ORDER — LACTATED RINGERS IV SOLN
INTRAVENOUS | Status: DC | PRN
  Administered 2019-01-22 (×2): via INTRAVENOUS

## 2019-01-22 MED ORDER — ACETAMINOPHEN 500 MG PO TABS
500.0000 mg | ORAL_TABLET | Freq: Four times a day (QID) | ORAL | Status: AC
  Administered 2019-01-22 – 2019-01-23 (×4): 500 mg via ORAL
  Filled 2019-01-22 (×4): qty 1

## 2019-01-22 MED ORDER — MENTHOL 3 MG MT LOZG: 1.0000 | LOZENGE | OROMUCOSAL | Status: DC | PRN

## 2019-01-22 MED ORDER — PROPOFOL 10 MG/ML IV BOLUS
INTRAVENOUS | Status: DC | PRN
  Administered 2019-01-22: 140 mg via INTRAVENOUS

## 2019-01-22 MED ORDER — ROCURONIUM BROMIDE 10 MG/ML (PF) SYRINGE
PREFILLED_SYRINGE | INTRAVENOUS | Status: DC | PRN
  Administered 2019-01-22: 50 mg via INTRAVENOUS
  Administered 2019-01-22: 10 mg via INTRAVENOUS

## 2019-01-22 MED ORDER — METOCLOPRAMIDE HCL 5 MG PO TABS: 5.0000 mg | ORAL_TABLET | Freq: Three times a day (TID) | ORAL | Status: DC | PRN

## 2019-01-22 MED ORDER — METOCLOPRAMIDE HCL 5 MG/ML IJ SOLN: 5.0000 mg | Freq: Three times a day (TID) | INTRAMUSCULAR | Status: DC | PRN

## 2019-01-22 SURGICAL SUPPLY — 62 items
BIPOLAR DEPUY 49 (Hips) ×2 IMPLANT
BIPOLAR DEPUY 49MM (Hips) ×1 IMPLANT
BLADE SAW SAG 73X25 THK (BLADE) ×1
BLADE SAW SGTL 73X25 THK (BLADE) ×2 IMPLANT
BRUSH FEMORAL CANAL (MISCELLANEOUS) IMPLANT
CHLORAPREP W/TINT 26 (MISCELLANEOUS) ×3 IMPLANT
COVER SURGICAL LIGHT HANDLE (MISCELLANEOUS) ×3 IMPLANT
COVER WAND RF STERILE (DRAPES) IMPLANT
DRAPE INCISE IOBAN 66X45 STRL (DRAPES) ×3 IMPLANT
DRAPE ORTHO SPLIT 77X108 STRL (DRAPES) ×4
DRAPE POUCH INSTRU U-SHP 10X18 (DRAPES) ×3 IMPLANT
DRAPE SURG ORHT 6 SPLT 77X108 (DRAPES) ×2 IMPLANT
DRAPE U-SHAPE 47X51 STRL (DRAPES) ×3 IMPLANT
DRAPE WARM FLUID 44X44 (DRAPES) ×3 IMPLANT
DRSG PAD ABDOMINAL 8X10 ST (GAUZE/BANDAGES/DRESSINGS) ×2 IMPLANT
ELECT BLADE TIP CTD 4 INCH (ELECTRODE) ×3 IMPLANT
ELECT REM PT RETURN 15FT ADLT (MISCELLANEOUS) ×3 IMPLANT
EVACUATOR 1/8 PVC DRAIN (DRAIN) ×3 IMPLANT
FACESHIELD WRAPAROUND (MASK) ×15 IMPLANT
FACESHIELD WRAPAROUND OR TEAM (MASK) ×5 IMPLANT
GAUZE SPONGE 4X4 12PLY STRL (GAUZE/BANDAGES/DRESSINGS) ×6 IMPLANT
GAUZE XEROFORM 5X9 LF (GAUZE/BANDAGES/DRESSINGS) ×3 IMPLANT
GLOVE BIOGEL PI IND STRL 8 (GLOVE) ×1 IMPLANT
GLOVE BIOGEL PI INDICATOR 8 (GLOVE) ×2
GLOVE ORTHO TXT STRL SZ7.5 (GLOVE) ×3 IMPLANT
GOWN STRL REUS W/TWL LRG LVL3 (GOWN DISPOSABLE) ×3 IMPLANT
HANDPIECE INTERPULSE COAX TIP (DISPOSABLE)
HEAD BIPOLAR DEPUY 49 (Hips) IMPLANT
HEAD FEM STD 28X+1.5 STRL (Hips) ×2 IMPLANT
IMMOBILIZER KNEE 20 (SOFTGOODS) ×3 IMPLANT
IMMOBILIZER KNEE 20 THIGH 36 (SOFTGOODS) ×1 IMPLANT
KIT BASIN OR (CUSTOM PROCEDURE TRAY) ×3 IMPLANT
KIT TURNOVER KIT A (KITS) IMPLANT
NDL MAYO CATGUT SZ4 TPR NDL (NEEDLE) ×1 IMPLANT
NEEDLE HYPO 22GX1.5 SAFETY (NEEDLE) IMPLANT
NEEDLE MAYO CATGUT SZ4 (NEEDLE) ×3 IMPLANT
NS IRRIG 1000ML POUR BTL (IV SOLUTION) ×6 IMPLANT
PACK TOTAL JOINT (CUSTOM PROCEDURE TRAY) ×3 IMPLANT
PASSER SUT SWANSON 36MM LOOP (INSTRUMENTS) ×3 IMPLANT
PENCIL SMOKE EVACUATOR (MISCELLANEOUS) IMPLANT
PROTECTOR NERVE ULNAR (MISCELLANEOUS) ×3 IMPLANT
SET HNDPC FAN SPRY TIP SCT (DISPOSABLE) IMPLANT
SPONGE DRAIN TRACH 4X4 STRL 2S (GAUZE/BANDAGES/DRESSINGS) ×2 IMPLANT
SPONGE LAP 18X18 RF (DISPOSABLE) IMPLANT
STAPLER VISISTAT 35W (STAPLE) ×3 IMPLANT
STEM FEM ACTIS HIGH SZ3 (Stem) ×2 IMPLANT
SUCTION FRAZIER HANDLE 12FR (TUBING) ×2
SUCTION TUBE FRAZIER 12FR DISP (TUBING) ×1 IMPLANT
SUT ETHIBOND NAB CT1 #1 30IN (SUTURE) ×12 IMPLANT
SUT VIC AB 0 CT1 27 (SUTURE) ×2
SUT VIC AB 0 CT1 27XBRD ANTBC (SUTURE) ×1 IMPLANT
SUT VIC AB 1 CT1 27 (SUTURE)
SUT VIC AB 1 CT1 27XBRD ANTBC (SUTURE) IMPLANT
SUT VIC AB 1 CTX 36 (SUTURE)
SUT VIC AB 1 CTX36XBRD ANBCTR (SUTURE) IMPLANT
SUT VIC AB 2-0 CT1 36 (SUTURE) ×9 IMPLANT
SYR 30ML LL (SYRINGE) IMPLANT
TAPE CLOTH SURG 6X10 WHT LF (GAUZE/BANDAGES/DRESSINGS) ×2 IMPLANT
TOWEL OR 17X26 10 PK STRL BLUE (TOWEL DISPOSABLE) ×6 IMPLANT
TOWER CARTRIDGE SMART MIX (DISPOSABLE) IMPLANT
TRAY FOLEY MTR SLVR 16FR STAT (SET/KITS/TRAYS/PACK) ×3 IMPLANT
WATER STERILE IRR 1000ML POUR (IV SOLUTION) ×3 IMPLANT

## 2019-01-22 NOTE — Consult Note (Signed)
Reason for Consult: Left femoral neck fracture, closed. Referring Physician: A. Doutova MD  Catherine Wong is an 73 y.o. female.  HPI: 73 year old female 6 days ago was in [place] fell.  She has history of polio with been wearing a brace in the left lower extremity.  She has had pain in her hip and has been mobile in a wheelchair.  She denies hitting her head no loss of consciousness.  No past history of problems with her hip other than the polio with left-sided weakness.  Past Medical History:  Diagnosis Date  . Hypertension   . Osteoporosis   . Rheumatoid arthritis [facility])     Past Surgical History:  Procedure Laterality Date  . BREAST SURGERY    . LAPAROSCOPY FOR ECTOPIC PREGNANCY      Family History  Problem Relation Age of Onset  . Breast cancer Mother   . CAD Mother   . Leukemia Other     Social History:  reports that she has been smoking. She has never used smokeless tobacco. She reports that she does not drink alcohol or use drugs.  Allergies: No Known Allergies  Medications: I have reviewed the patient's current medications.  Results for orders placed or performed during the hospital encounter of 01/21/19 (from the past 48 hour(s))  Basic metabolic panel     Status: Abnormal   Collection Time: 01/21/19 12:46 PM  Result Value Ref Range   Sodium 136 135 - 145 mmol/L   Potassium 2.7 (LL) 3.5 - 5.1 mmol/L    Comment: CRITICAL RESULT CALLED TO, READ BACK BY AND VERIFIED WITH: Heywood Bene RN 1356 [id number] PHILLIPS C    Chloride 97 (L) 98 - 111 mmol/L   CO2 28 22 - 32 mmol/L   Glucose, Bld 102 (H) 70 - 99 mg/dL   BUN 24 (H) 8 - 23 mg/dL   Creatinine, Ser 9.62 0.44 - 1.00 mg/dL   Calcium 9.2 8.9 - 95.2 mg/dL   GFR calc non Af Amer >60 >60 mL/min   GFR calc Af Amer >60 >60 mL/min   Anion gap 11 5 - 15    Comment: Performed at [facility], [address] [place], [place] [address]  CBC WITH DIFFERENTIAL     Status: Abnormal   Collection Time: 01/21/19  12:46 PM  Result Value Ref Range   WBC 8.9 4.0 - 10.5 K/uL   RBC 4.23 3.87 - 5.11 MIL/uL   Hemoglobin 12.0 12.0 - 15.0 g/dL   HCT 44.0 (L) 10.2 - 72.5 %   MCV 84.2 80.0 - 100.0 fL   MCH 28.4 26.0 - 34.0 pg   MCHC 33.7 30.0 - 36.0 g/dL   RDW 36.6 44.0 - 34.7 %   Platelets 254 150 - 400 K/uL   nRBC 0.0 0.0 - 0.2 %   Neutrophils Relative % 79 %   Neutro Abs 7.1 1.7 - 7.7 K/uL   Lymphocytes Relative 9 %   Lymphs Abs 0.8 0.7 - 4.0 K/uL   Monocytes Relative 9 %   Monocytes Absolute 0.8 0.1 - 1.0 K/uL   Eosinophils Relative 1 %   Eosinophils Absolute 0.1 0.0 - 0.5 K/uL   Basophils Relative 1 %   Basophils Absolute 0.1 0.0 - 0.1 K/uL   Immature Granulocytes 1 %   Abs Immature Granulocytes 0.05 0.00 - 0.07 K/uL    Comment: Performed at [facility], [address] [place], [place] [address]  Protime-INR  Status: None   Collection Time: 01/21/19 12:46 PM  Result Value Ref Range   Prothrombin Time 12.7 11.4 - 15.2 seconds   INR 1.0 0.8 - 1.2    Comment: (NOTE) INR goal varies based on device and disease states. Performed at [facility] [place], [address] [place], [place] [address]   SARS CORONAVIRUS 2 (TAT 6-24 HRS) Nasopharyngeal Nasopharyngeal Swab     Status: None   Collection Time: 01/21/19 12:46 PM   Specimen: Nasopharyngeal Swab  Result Value Ref Range   SARS Coronavirus 2 NEGATIVE NEGATIVE    Comment: (NOTE) SARS-CoV-2 target nucleic acids are NOT DETECTED. The SARS-CoV-2 RNA is generally detectable in upper and lower respiratory specimens during the acute phase of infection. Negative results do not preclude SARS-CoV-2 infection, do not rule out co-infections with other pathogens, and should not be used as the sole basis for treatment or other patient management decisions. Negative results must be combined with clinical observations, patient history, and epidemiological information. The expected result is Negative. Fact Sheet for Patients:  https://www.fda.gov/media/138098/download Fact Sheet for Healthcare Providers: https://www.fda.gov/media/138095/download This test is not yet approved or cleared by the [place] FDA and  has been authorized for detection and/or diagnosis of SARS-CoV-2 by FDA under an Emergency Use Authorization (EUA). This EUA will remain  in effect (meaning this test can be used) for the duration of the COVID-19 declaration under Section 56 4(b)(1) of the Act, 21 U.S.C. section 360bbb-3(b)(1), unless the authorization is terminated or revoked sooner. Performed at [facility], [address] [place], [place] [address]   MRSA PCR Screening     Status: None   Collection Time: 01/21/19  5:47 PM   Specimen: Nasal Mucosa; Nasopharyngeal  Result Value Ref Range   MRSA by PCR NEGATIVE NEGATIVE    Comment:        The GeneXpert MRSA Assay (FDA approved for NASAL specimens only), is one component of a comprehensive MRSA colonization surveillance program. It is not intended to diagnose MRSA infection nor to guide or monitor treatment for MRSA infections. Performed at [facility], [address] [place], [place] [address]   Type and screen     Status: None   Collection Time: 01/21/19  7:37 PM  Result Value Ref Range   ABO/RH(D) O NEG    Antibody Screen POS    Sample Expiration 01/24/2019,2359    Antibody Identification ANTI FYA (Duffy a)    PT AG Type      NEGATIVE FOR DUFFY A ANTIGEN Performed at [facility], [address] [place], [place] [address]   Basic metabolic panel     Status: Abnormal   Collection Time: 01/21/19  7:37 PM  Result Value Ref Range   Sodium 138 135 - 145 mmol/L   Potassium 3.3 (L) 3.5 - 5.1 mmol/L   Chloride 99 98 - 111 mmol/L   CO2 27 22 - 32 mmol/L   Glucose, Bld 121 (H) 70 - 99 mg/dL   BUN 20 8 - 23 mg/dL   Creatinine, Ser 0.70 0.44 - 1.00 mg/dL   Calcium 9.0 8.9 - 10.3 mg/dL   GFR calc non Af Amer >60 >60  mL/min   GFR calc Af Amer >60 >60 mL/min   Anion gap 12 5 - 15    Comment: Performed at [facility], [address] [place], [place] [address]  Magnesium     Status: None   Collection Time:   01/21/19  7:37 PM  Result Value Ref Range   Magnesium 1.9 1.7 - 2.4 mg/dL    Comment: Performed at [facility], [address], [place], [place] [address]  CBC     Status: Abnormal   Collection Time: 01/22/19  3:08 AM  Result Value Ref Range   WBC 6.3 4.0 - 10.5 K/uL   RBC 3.93 3.87 - 5.11 MIL/uL   Hemoglobin 11.2 (L) 12.0 - 15.0 g/dL   HCT 76.1 (L) 95.0 - 93.2 %   MCV 87.5 80.0 - 100.0 fL   MCH 28.5 26.0 - 34.0 pg   MCHC 32.6 30.0 - 36.0 g/dL   RDW 67.1 24.5 - 80.9 %   Platelets 240 150 - 400 K/uL   nRBC 0.0 0.0 - 0.2 %    Comment: Performed at [facility], [address], [place], [place] [address]  Basic metabolic panel     Status: Abnormal   Collection Time: 01/22/19  3:08 AM  Result Value Ref Range   Sodium 139 135 - 145 mmol/L   Potassium 3.8 3.5 - 5.1 mmol/L   Chloride 101 98 - 111 mmol/L   CO2 28 22 - 32 mmol/L   Glucose, Bld 156 (H) 70 - 99 mg/dL   BUN 19 8 - 23 mg/dL   Creatinine, Ser 2.50 0.44 - 1.00 mg/dL   Calcium 9.1 8.9 - 53.9 mg/dL   GFR calc non Af Amer >60 >60 mL/min   GFR calc Af Amer >60 >60 mL/min   Anion gap 10 5 - 15    Comment: Performed at [facility], [address] [place], [place] [address]  Albumin     Status: None   Collection Time: 01/22/19  3:08 AM  Result Value Ref Range   Albumin 3.5 3.5 - 5.0 g/dL    Comment: Performed at [facility], [address] [place], [place] [address]    Dg Chest Port 1 View  Result Date: 01/21/2019 CLINICAL DATA:  Preop exam EXAM: PORTABLE CHEST 1 VIEW COMPARISON:  None FINDINGS: The heart size and mediastinal contours are within normal limits. Both lungs are clear. The visualized skeletal structures are unremarkable.  IMPRESSION: No acute cardiopulmonary disease. Electronically Signed   By: Donzetta Kohut M.D.   On: 01/21/2019 12:42   Dg Hip Unilat W Or Wo Pelvis 2-3 Views Left  Result Date: 01/21/2019 CLINICAL DATA:  Left hip pain, status post fall EXAM: DG HIP (WITH OR WITHOUT PELVIS) 2-3V LEFT COMPARISON:  None. FINDINGS: Nondisplaced subcapital left hip fracture. Status post ORIF of the right hip. Visualized bony pelvis appears intact. Degenerative changes of the lower lumbar spine. IMPRESSION: Nondisplaced subcapital left hip fracture. Electronically Signed   By: Charline Bills M.D.   On: 01/21/2019 12:18    ROS patient denies CVA positive for hypertension, osteoporosis, rheumatoid arthritis.  History of breast cancer 19 years ago..  Former smoker quit 2 years ago.  Community ambulator was visiting family by car traveling with family members. Physical Exam  Constitutional: She is oriented to person, place, and time. She appears well-developed.  HENT:  Head: Normocephalic.  Eyes: Pupils are equal, round, and reactive to light. Conjunctivae are normal.  Neck: No tracheal deviation present. No thyromegaly present.  Cardiovascular: Normal rate and regular rhythm.  Respiratory: Effort normal. She has no wheezes.  GI: Soft.  Musculoskeletal:     Comments: Pain with left hip range of motion.  Distal pulses intact  sciatic functions intact.  Neurological: She is alert and oriented to person, place, and time.  Skin: Skin is warm and dry.  Psychiatric: She has a normal mood and affect. Her behavior is normal.   .    Assessment/Plan: Left subcapital femoral neck fracture.  Patient has history of breast cancer but no evidence of pathologic lesion.  She does have osteoporosis.  Left lower extremity is weak she wears a brace and with polio and weakness on left leg plan would be bipolar hemiarthroplasty press-fit so that she can begin ambulating immediately the day after surgery.  Should be on aspirin for DVT  prophylaxis.  Plan procedures gust with patient and family risk surgery discussed questions elicited and answered.  She understands and requests we proceed.  Marybelle Killings 01/22/2019, 9:33 AM

## 2019-01-22 NOTE — Plan of Care (Signed)

## 2019-01-22 NOTE — Anesthesia Postprocedure Evaluation (Signed)
Anesthesia Post Note  Patient: Catherine Wong  Procedure(s) Performed: ARTHROPLASTY BIPOLAR HIP (HEMIARTHROPLASTY) (Left Hip)     Patient location during evaluation: PACU Anesthesia Type: General Level of consciousness: awake and alert and oriented Pain management: pain level controlled Vital Signs Assessment: post-procedure vital signs reviewed and stable Respiratory status: spontaneous breathing, nonlabored ventilation and respiratory function stable Cardiovascular status: blood pressure returned to baseline Postop Assessment: no apparent nausea or vomiting Anesthetic complications: no    Last Vitals:  Vitals:   01/22/19 1611 01/22/19 1711  BP: (!) 121/98 129/76  Pulse: 67 60  Resp: 16 16  Temp: 36.7 C 36.6 C  SpO2: 100% 100%    Last Pain:  Vitals:   01/22/19 1330  TempSrc:   PainSc: [place]

## 2019-01-22 NOTE — H&P (View-Only) (Signed)
Reason for Consult: Left femoral neck fracture, closed. Referring Physician: A. Doutova MD  Catherine Wong is an 73 y.o. female.  HPI: 73 year old female 6 days ago was in [place] fell.  She has history of polio with been wearing a brace in the left lower extremity.  She has had pain in her hip and has been mobile in a wheelchair.  She denies hitting her head no loss of consciousness.  No past history of problems with her hip other than the polio with left-sided weakness.  Past Medical History:  Diagnosis Date  . Hypertension   . Osteoporosis   . Rheumatoid arthritis [facility])     Past Surgical History:  Procedure Laterality Date  . BREAST SURGERY    . LAPAROSCOPY FOR ECTOPIC PREGNANCY      Family History  Problem Relation Age of Onset  . Breast cancer Mother   . CAD Mother   . Leukemia Other     Social History:  reports that she has been smoking. She has never used smokeless tobacco. She reports that she does not drink alcohol or use drugs.  Allergies: No Known Allergies  Medications: I have reviewed the patient's current medications.  Results for orders placed or performed during the hospital encounter of 01/21/19 (from the past 48 hour(s))  Basic metabolic panel     Status: Abnormal   Collection Time: 01/21/19 12:46 PM  Result Value Ref Range   Sodium 136 135 - 145 mmol/L   Potassium 2.7 (LL) 3.5 - 5.1 mmol/L    Comment: CRITICAL RESULT CALLED TO, READ BACK BY AND VERIFIED WITH: Heywood Bene RN 1356 [id number] PHILLIPS C    Chloride 97 (L) 98 - 111 mmol/L   CO2 28 22 - 32 mmol/L   Glucose, Bld 102 (H) 70 - 99 mg/dL   BUN 24 (H) 8 - 23 mg/dL   Creatinine, Ser 9.62 0.44 - 1.00 mg/dL   Calcium 9.2 8.9 - 95.2 mg/dL   GFR calc non Af Amer >60 >60 mL/min   GFR calc Af Amer >60 >60 mL/min   Anion gap 11 5 - 15    Comment: Performed at [facility], [address] [place], [place] [address]  CBC WITH DIFFERENTIAL     Status: Abnormal   Collection Time: 01/21/19  12:46 PM  Result Value Ref Range   WBC 8.9 4.0 - 10.5 K/uL   RBC 4.23 3.87 - 5.11 MIL/uL   Hemoglobin 12.0 12.0 - 15.0 g/dL   HCT 44.0 (L) 10.2 - 72.5 %   MCV 84.2 80.0 - 100.0 fL   MCH 28.4 26.0 - 34.0 pg   MCHC 33.7 30.0 - 36.0 g/dL   RDW 36.6 44.0 - 34.7 %   Platelets 254 150 - 400 K/uL   nRBC 0.0 0.0 - 0.2 %   Neutrophils Relative % 79 %   Neutro Abs 7.1 1.7 - 7.7 K/uL   Lymphocytes Relative 9 %   Lymphs Abs 0.8 0.7 - 4.0 K/uL   Monocytes Relative 9 %   Monocytes Absolute 0.8 0.1 - 1.0 K/uL   Eosinophils Relative 1 %   Eosinophils Absolute 0.1 0.0 - 0.5 K/uL   Basophils Relative 1 %   Basophils Absolute 0.1 0.0 - 0.1 K/uL   Immature Granulocytes 1 %   Abs Immature Granulocytes 0.05 0.00 - 0.07 K/uL    Comment: Performed at [facility], [address] [place], [place] [address]  Protime-INR  Status: None   Collection Time: 01/21/19 12:46 PM  Result Value Ref Range   Prothrombin Time 12.7 11.4 - 15.2 seconds   INR 1.0 0.8 - 1.2    Comment: (NOTE) INR goal varies based on device and disease states. Performed at [facility] [place], [address] [place], [place] [address]   SARS CORONAVIRUS 2 (TAT 6-24 HRS) Nasopharyngeal Nasopharyngeal Swab     Status: None   Collection Time: 01/21/19 12:46 PM   Specimen: Nasopharyngeal Swab  Result Value Ref Range   SARS Coronavirus 2 NEGATIVE NEGATIVE    Comment: (NOTE) SARS-CoV-2 target nucleic acids are NOT DETECTED. The SARS-CoV-2 RNA is generally detectable in upper and lower respiratory specimens during the acute phase of infection. Negative results do not preclude SARS-CoV-2 infection, do not rule out co-infections with other pathogens, and should not be used as the sole basis for treatment or other patient management decisions. Negative results must be combined with clinical observations, patient history, and epidemiological information. The expected result is Negative. Fact Sheet for Patients:  HairSlick.nohttps://www.fda.gov/media/138098/download Fact Sheet for Healthcare Providers: quierodirigir.comhttps://www.fda.gov/media/138095/download This test is not yet approved or cleared by the [place] FDA and  has been authorized for detection and/or diagnosis of SARS-CoV-2 by FDA under an Emergency Use Authorization (EUA). This EUA will remain  in effect (meaning this test can be used) for the duration of the COVID-19 declaration under Section 56 4(b)(1) of the Act, 21 U.S.C. section 360bbb-3(b)(1), unless the authorization is terminated or revoked sooner. Performed at [facility], [address] [place], [place] [address]   MRSA PCR Screening     Status: None   Collection Time: 01/21/19  5:47 PM   Specimen: Nasal Mucosa; Nasopharyngeal  Result Value Ref Range   MRSA by PCR NEGATIVE NEGATIVE    Comment:        The GeneXpert MRSA Assay (FDA approved for NASAL specimens only), is one component of a comprehensive MRSA colonization surveillance program. It is not intended to diagnose MRSA infection nor to guide or monitor treatment for MRSA infections. Performed at [facility], [address] [place], [place] [address]   Type and screen     Status: None   Collection Time: 01/21/19  7:37 PM  Result Value Ref Range   ABO/RH(D) O NEG    Antibody Screen POS    Sample Expiration 01/24/2019,2359    Antibody Identification ANTI FYA (Duffy a)    PT AG Type      NEGATIVE FOR DUFFY A ANTIGEN Performed at [facility], [address] [place], [place] [address]   Basic metabolic panel     Status: Abnormal   Collection Time: 01/21/19  7:37 PM  Result Value Ref Range   Sodium 138 135 - 145 mmol/L   Potassium 3.3 (L) 3.5 - 5.1 mmol/L   Chloride 99 98 - 111 mmol/L   CO2 27 22 - 32 mmol/L   Glucose, Bld 121 (H) 70 - 99 mg/dL   BUN 20 8 - 23 mg/dL   Creatinine, Ser 6.570.70 0.44 - 1.00 mg/dL   Calcium 9.0 8.9 - 84.610.3 mg/dL   GFR calc non Af Amer >60 >60  mL/min   GFR calc Af Amer >60 >60 mL/min   Anion gap 12 5 - 15    Comment: Performed at [facility], [address] [place], [place] [address]  Magnesium     Status: None   Collection Time:  01/21/19  7:37 PM  Result Value Ref Range   Magnesium 1.9 1.7 - 2.4 mg/dL    Comment: Performed at [facility] [place] [facility], [address], [place], [place] [address]  CBC     Status: Abnormal   Collection Time: 01/22/19  3:08 AM  Result Value Ref Range   WBC 6.3 4.0 - 10.5 K/uL   RBC 3.93 3.87 - 5.11 MIL/uL   Hemoglobin 11.2 (L) 12.0 - 15.0 g/dL   HCT 76.1 (L) 95.0 - 93.2 %   MCV 87.5 80.0 - 100.0 fL   MCH 28.5 26.0 - 34.0 pg   MCHC 32.6 30.0 - 36.0 g/dL   RDW 67.1 24.5 - 80.9 %   Platelets 240 150 - 400 K/uL   nRBC 0.0 0.0 - 0.2 %    Comment: Performed at [facility], [address], [place], [place] [address]  Basic metabolic panel     Status: Abnormal   Collection Time: 01/22/19  3:08 AM  Result Value Ref Range   Sodium 139 135 - 145 mmol/L   Potassium 3.8 3.5 - 5.1 mmol/L   Chloride 101 98 - 111 mmol/L   CO2 28 22 - 32 mmol/L   Glucose, Bld 156 (H) 70 - 99 mg/dL   BUN 19 8 - 23 mg/dL   Creatinine, Ser 2.50 0.44 - 1.00 mg/dL   Calcium 9.1 8.9 - 53.9 mg/dL   GFR calc non Af Amer >60 >60 mL/min   GFR calc Af Amer >60 >60 mL/min   Anion gap 10 5 - 15    Comment: Performed at [facility], [address] [place], [place] [address]  Albumin     Status: None   Collection Time: 01/22/19  3:08 AM  Result Value Ref Range   Albumin 3.5 3.5 - 5.0 g/dL    Comment: Performed at [facility], [address] [place], [place] [address]    Dg Chest Port 1 View  Result Date: 01/21/2019 CLINICAL DATA:  Preop exam EXAM: PORTABLE CHEST 1 VIEW COMPARISON:  None FINDINGS: The heart size and mediastinal contours are within normal limits. Both lungs are clear. The visualized skeletal structures are unremarkable.  IMPRESSION: No acute cardiopulmonary disease. Electronically Signed   By: Donzetta Kohut M.D.   On: 01/21/2019 12:42   Dg Hip Unilat W Or Wo Pelvis 2-3 Views Left  Result Date: 01/21/2019 CLINICAL DATA:  Left hip pain, status post fall EXAM: DG HIP (WITH OR WITHOUT PELVIS) 2-3V LEFT COMPARISON:  None. FINDINGS: Nondisplaced subcapital left hip fracture. Status post ORIF of the right hip. Visualized bony pelvis appears intact. Degenerative changes of the lower lumbar spine. IMPRESSION: Nondisplaced subcapital left hip fracture. Electronically Signed   By: Charline Bills M.D.   On: 01/21/2019 12:18    ROS patient denies CVA positive for hypertension, osteoporosis, rheumatoid arthritis.  History of breast cancer 19 years ago..  Former smoker quit 2 years ago.  Community ambulator was visiting family by car traveling with family members. Physical Exam  Constitutional: She is oriented to person, place, and time. She appears well-developed.  HENT:  Head: Normocephalic.  Eyes: Pupils are equal, round, and reactive to light. Conjunctivae are normal.  Neck: No tracheal deviation present. No thyromegaly present.  Cardiovascular: Normal rate and regular rhythm.  Respiratory: Effort normal. She has no wheezes.  GI: Soft.  Musculoskeletal:     Comments: Pain with left hip range of motion.  Distal pulses intact  sciatic functions intact.  Neurological: She is alert and oriented to person, place, and time.  Skin: Skin is warm and dry.  Psychiatric: She has a normal mood and affect. Her behavior is normal.   .    Assessment/Plan: Left subcapital femoral neck fracture.  Patient has history of breast cancer but no evidence of pathologic lesion.  She does have osteoporosis.  Left lower extremity is weak she wears a brace and with polio and weakness on left leg plan would be bipolar hemiarthroplasty press-fit so that she can begin ambulating immediately the day after surgery.  Should be on aspirin for DVT  prophylaxis.  Plan procedures gust with patient and family risk surgery discussed questions elicited and answered.  She understands and requests we proceed.  Marybelle Killings 01/22/2019, 9:33 AM

## 2019-01-22 NOTE — Progress Notes (Signed)
Left lower extremity venous duplex has been completed. Preliminary results can be found in CV Proc through chart review.   01/22/19 10:09 AM Catherine Wong RVT

## 2019-01-22 NOTE — Interval H&P Note (Signed)
History and Physical Interval Note:  01/22/2019 10:45 AM  Catherine Wong  has presented today for surgery, with the diagnosis of left femoral neck fracture.  The various methods of treatment have been discussed with the patient and family. After consideration of risks, benefits and other options for treatment, the patient has consented to  Procedure(s): ARTHROPLASTY BIPOLAR HIP (HEMIARTHROPLASTY) (Left) as a surgical intervention.  The patient's history has been reviewed, patient examined, no change in status, stable for surgery.  I have reviewed the patient's chart and labs.  Questions were answered to the patient's satisfaction.     Marybelle Killings

## 2019-01-22 NOTE — Anesthesia Procedure Notes (Signed)
Procedure Name: Intubation Date/Time: 01/22/2019 11:14 AM Performed by: Mitzie Na, CRNA Pre-anesthesia Checklist: Patient identified, Emergency Drugs available, Suction available and Patient being monitored Patient Re-evaluated:Patient Re-evaluated prior to induction Oxygen Delivery Method: Circle system utilized Preoxygenation: Pre-oxygenation with 100% oxygen Induction Type: IV induction Ventilation: Mask ventilation without difficulty Laryngoscope Size: Miller and 2 Grade View: Grade I Tube type: Oral Tube size: 7.0 mm Number of attempts: 1 Airway Equipment and Method: Stylet and Oral airway Placement Confirmation: ETT inserted through vocal cords under direct vision,  positive ETCO2 and breath sounds checked- equal and bilateral Secured at: 24 cm Tube secured with: Tape Dental Injury: Teeth and Oropharynx as per pre-operative assessment

## 2019-01-22 NOTE — Op Note (Signed)
Preop diagnosis: Left displaced femoral neck fracture.  Postop diagnosis: Same  Procedure: Left hip bipolar hemiarthroplasty.  Surgeon: Rodell Perna, MD  Anesthesia: General plus Marcaine skin local 15 cc  EBL: 200 cc  Drains: None  Implants:depuy # Actis stem 65mm bipolar with 1.5 neck length bipolar.  Procedure: After induction of general anesthesia preoperative antibiotics Ancef patient placed lateral position timeout procedure mark 7 frame used to stabilize patient DuraPrep after 1015 drape was applied and impervious stockinette and usual hip sheets drapes sterile skin marker Betadine Steri-Drape x2 seal the skin.  Posterior approach was made gently retractor the blades were placed.  Piriformis had atrophy likely related to her polio.  There is gluteus medius atrophy.  Posterior capsule was open there was a mixture of old blood and serous fluid consistent with her 5 to 6-day history of fall in [place] with femoral neck fracture.  Capsule was opened neck was cut 1 fingerbreadth above the lesser troches head was removed with a corkscrew sized at 49.  Preparation of the canal progressing up to #3 which gave excellent tight fit and a 4 would not be able to fit in the canal.  Calcar reamer 49+1.5 neck length bipolar with excellent stability after it was reduced protecting the sciatic nerve.  Permanent implants were inserted assembling the bipolar in the normal fashion with findings of excellent stability.  Leg lengths were equal.  Sciatic nerve was in good position.  Posterior capsule was repaired with interrupted #1 Ethibond sutures since piriformis was atrophy.  Ethibond in the gluteus maximus fascia and tensor fascia.  2-0 Vicryl subtenons tissue skin staple closure postop dressing tape and transferred to care room knee immobilizer.  Patient tolerated procedure well.  She will be out of bed with therapy weightbearing as tolerated with her normal brace on her left leg that she wears due to polio.

## 2019-01-22 NOTE — Transfer of Care (Signed)
Immediate Anesthesia Transfer of Care Note  Patient: Catherine Wong  Procedure(s) Performed: ARTHROPLASTY BIPOLAR HIP (HEMIARTHROPLASTY) (Left Hip)  Patient Location: PACU  Anesthesia Type:General  Level of Consciousness: awake and alert   Airway & Oxygen Therapy: Patient Spontanous Breathing and Patient connected to face mask oxygen  Post-op Assessment: Report given to RN and Post -op Vital signs reviewed and stable  Post vital signs: Reviewed and stable  Last Vitals:  Vitals Value Taken Time  BP    Temp    Pulse    Resp    SpO2      Last Pain:  Vitals:   01/22/19 1020  TempSrc:   PainSc: 6       Patients Stated Pain Goal: 4 (25/75/05 1833)  Complications: No apparent anesthesia complications

## 2019-01-22 NOTE — Progress Notes (Signed)
PROGRESS NOTE  Catherine Wong  DOB: 08-03-1945  PCP: Patient, No Pcp Per [id number]  DOA: 01/21/2019  LOS: 1 day   Chief Complaint  Patient presents with  . Fall   Brief narrative: Catherine Wong is a 73 y.o. female with PMH of poliomyelitis as child with left leg chronic weakness, hypertension, osteoporosis, rheumatoid arthritis, hx of breast cancer. She presented to the ED on 12/5 after a fall 5 days ago leading to left hip pain. Patient has chronic left leg weakness secondary to poliomyelitis as a child.  She has been walking with a brace ever since she was 73 years old.   5 days ago, while preparing to move from [place] to here, patient had a mechanical fall and started hurting her left hip.  They travelled anyway.  She has been using a wheelchair since then.  Pain got worse despite using ibuprofen and has presented [facility] ED [facility] on 12/5.  In the ED, patient is hemodynamically stable. X-ray left hip showed subcapital nondisplaced hip fracture. Blood work showed potassium level low at 2.7. Orthopedic surgery Dr. Lorin Mercy was consulted from ED. Patient admitted to hospitalist medicine service for further evaluation and management.  Subjective: Patient was seen and examined this morning.  Pleasant elderly Caucasian female.  Sitting up in bed.  Not in distress.  Pain controlled.  Assessment/Plan: Left hip fracture -Mechanical fall leading to subcapital nondisplaced hip fracture. -10/6, underwent left hip bipolar hemiarthroplasty by Dr. Lorin Mercy today. -Pain regimen, due to prophylaxis per orthopedic surgery team. -Calcium and Vitamin D supplementation.  Hypokalemia -Potassium level low at 2.7 on admission.  IV replacement given.  Repeat potassium 3.3 this morning.  Repeat tomorrow.  Chronic left leg weakness  -Secondary to poliomyelitis as a child.   -Mobility may be an issue because of fracture.  PT evaluation.  Essential hypertension  -Home meds  include lisinopril and HCTZ.  Both on hold perioperatively. -Continue to monitor blood pressure.  Resume from tomorrow if blood pressure and renal function allows.  Hyperlipidemia -Statin  Breast cancer - in Left breast in remission 19 years ago  Rheumatoid arthritis - only on ibuprofen  Mobility: PT eval postprocedure Diet: Cardiac diet post procedure Fluid: Encourage oral hydration.  Ibuprofen as needed DVT prophylaxis:  SCDs Code Status:  Full code Family Communication:  None at bedside Expected Discharge:  PT OT eval.  May need rehab  Consultants:  Orthopedics  Procedures:    Antimicrobials: Anti-infectives (From admission, onward)   Start     Dose/Rate Route Frequency Ordered Stop   01/22/19 1050  ceFAZolin (ANCEF) 2-4 GM/100ML-% IVPB    Note to Pharmacy: Caryl Pina   : cabinet override      01/22/19 1050 01/22/19 1121   01/22/19 0600  ceFAZolin (ANCEF) IVPB 2g/100 mL premix     2 g 200 mL/hr over 30 Minutes Intravenous On call to O.R. 01/21/19 2204 01/22/19 1121        Code Status: Full Code   Diet Order            Diet NPO time specified  Diet effective now              Infusions:  . sodium chloride 75 mL/hr at 01/22/19 1318  . [MAR Hold] sodium chloride Stopped (01/21/19 1730)  . acetaminophen    . methocarbamol (ROBAXIN) IV Stopped (01/22/19 1327)  . methocarbamol      Scheduled Meds: . sodium chloride   Intravenous  Once  . chlorhexidine  60 mL Topical Once  . fentaNYL      . [MAR Hold] lisinopril  20 mg Oral Daily  . povidone-iodine  2 application Topical Once    PRN meds: [MAR Hold] sodium chloride, 0.9 % irrigation (POUR BTL), acetaminophen, fentaNYL (SUBLIMAZE) injection, [MAR Hold] HYDROcodone-acetaminophen, [MAR Hold]  HYDROmorphone (DILAUDID) injection, methocarbamol **OR** methocarbamol (ROBAXIN) IV, [MAR Hold] ondansetron (ZOFRAN) IV **OR** [MAR Hold] ondansetron, oxyCODONE **OR** oxyCODONE, promethazine, sterile water for  irrigation   Objective: Vitals:   01/22/19 1315 01/22/19 1330  BP: 122/78 134/70  Pulse: (!) 57 (!) 54  Resp: 17 (!) 23  Temp:  97.9 F (36.6 C)  SpO2: 96% 97%    Intake/Output Summary (Last 24 hours) at 01/22/2019 1345 Last data filed at 01/22/2019 1330 Gross per 24 hour  Intake 2547.94 ml  Output 1925 ml  Net 622.94 ml   Filed Weights   01/21/19 1146 01/21/19 1700  Weight: 78 kg 73.9 kg   Weight change:  Body mass index is 28.86 kg/m.   Physical Exam: General exam: Appears calm and comfortable.  Skin: No rashes, lesions or ulcers. HEENT: Atraumatic, normocephalic, supple neck, no obvious bleeding Lungs: Clear to auscultation bilaterally CVS: Regular rate and rhythm, no murmur GI/Abd soft, nontender, nondistended, bowel sound present CNS: Alert, awake, oriented x3 Psychiatry: Mood appropriate Extremities: No pedal edema, no calf tenderness  Data Review: I have personally reviewed the laboratory data and studies available.  Recent Labs  Lab 01/21/19 1246 01/22/19 0308  WBC 8.9 6.3  NEUTROABS 7.1  --   HGB 12.0 11.2*  HCT 35.6* 34.4*  MCV 84.2 87.5  PLT 254 240   Recent Labs  Lab 01/21/19 1246 01/21/19 1937 01/22/19 0308  NA 136 138 139  K 2.7* 3.3* 3.8  CL 97* 99 101  CO2 28 27 28   GLUCOSE 102* 121* 156*  BUN 24* 20 19  CREATININE 0.78 0.70 0.69  CALCIUM 9.2 9.0 9.1  MG  --  1.9  --     , MD  [facility] 01/22/2019

## 2019-01-22 NOTE — Anesthesia Procedure Notes (Signed)
Date/Time: 01/22/2019 12:24 PM Performed by: Cynda Familia, CRNA Oxygen Delivery Method: Simple face mask Placement Confirmation: positive ETCO2 and breath sounds checked- equal and bilateral Dental Injury: Teeth and Oropharynx as per pre-operative assessment

## 2019-01-23 ENCOUNTER — Encounter (HOSPITAL_COMMUNITY): Payer: Self-pay | Admitting: Orthopaedic Surgery

## 2019-01-23 LAB — CBC
HCT: 30 % — ABNORMAL LOW (ref 36.0–46.0)
Hemoglobin: 9.7 g/dL — ABNORMAL LOW (ref 12.0–15.0)
MCH: 28.4 pg (ref 26.0–34.0)
MCHC: 32.3 g/dL (ref 30.0–36.0)
MCV: 88 fL (ref 80.0–100.0)
Platelets: 214 10*3/uL (ref 150–400)
RBC: 3.41 MIL/uL — ABNORMAL LOW (ref 3.87–5.11)
RDW: 13.2 % (ref 11.5–15.5)
WBC: 9.1 10*3/uL (ref 4.0–10.5)
nRBC: 0 % (ref 0.0–0.2)

## 2019-01-23 LAB — BASIC METABOLIC PANEL
Anion gap: 9 (ref 5–15)
BUN: 16 mg/dL (ref 8–23)
CO2: 25 mmol/L (ref 22–32)
Calcium: 8.4 mg/dL — ABNORMAL LOW (ref 8.9–10.3)
Chloride: 101 mmol/L (ref 98–111)
Creatinine, Ser: 0.48 mg/dL (ref 0.44–1.00)
GFR calc Af Amer: >60 mL/min (ref >60)
GFR calc non Af Amer: >60 mL/min (ref >60)
Glucose, Bld: 101 mg/dL — ABNORMAL HIGH (ref 70–99)
Potassium: 3.5 mmol/L (ref 3.5–5.1)
Sodium: 135 mmol/L (ref 135–145)

## 2019-01-23 NOTE — Progress Notes (Signed)
Foley removed at 1300, pt unable to void by 1900.  Bladder scan showed 155mL. Pt not in pain, distended or in discomfort. Md paged and made aware.  RN to monitor.

## 2019-01-23 NOTE — Progress Notes (Signed)
PROGRESS NOTE  Catherine Wong  DOB: Jun 02, 1945  PCP: Patient, No Pcp Per [id number]  DOA: 01/21/2019  LOS: 2 days   Chief Complaint  Patient presents with  . Fall   Brief narrative: Jocilynn Grade is a 73 y.o. female with PMH of poliomyelitis as child with left leg chronic weakness, hypertension, osteoporosis, rheumatoid arthritis, hx of breast cancer. She presented to the ED on 12/5 after a fall 5 days ago leading to left hip pain. Patient has chronic left leg weakness secondary to poliomyelitis as a child.  She has been walking with a brace ever since she was 73 years old.   5 days ago, while preparing to move from [place] to here, patient had a mechanical fall and started hurting her left hip.  They travelled anyway.  She has been using a wheelchair since then.  Pain got worse despite using ibuprofen and has presented to the ED at [facility] on 12/5.  In the ED, patient is hemodynamically stable. X-ray left hip showed subcapital nondisplaced hip fracture. Blood work showed potassium level low at 2.7. Orthopedic surgery Dr. Ophelia Charter was consulted from ED. Patient admitted to hospitalist medicine service for further evaluation and management. -10/6, underwent left hip bipolar hemiarthroplasty by Dr. Ophelia Charter .  Subjective: Patient was seen and examined this morning.   Pleasant elderly Caucasian female. Not in distress.  Pain controlled.  Assessment/Plan: Left hip fracture -Mechanical fall leading to subcapital nondisplaced hip fracture. -10/6, underwent left hip bipolar hemiarthroplasty by Dr. Ophelia Charter . -Pain regimen, due to prophylaxis per orthopedic surgery team. -Calcium and Vitamin D supplementation. -PT eval appreciated.  Home health PT recommended.  Hypokalemia Potassium level improved with replacement.  Chronic left leg weakness  -Secondary to poliomyelitis as a child.   -Mobility may be an issue because of fracture.  PT evaluation.  Essential  hypertension  -Home meds include lisinopril and HCTZ.  Both are currently on hold. -If blood pressure rising up and creatinine stable, will resume them tomorrow.  Hyperlipidemia -Statin  Breast cancer - in Left breast in remission 19 years ago  Rheumatoid arthritis - only on ibuprofen as needed at hom  Mobility: Home health PT recommended Diet: Cardiac diet post procedure Fluid: Encourage oral hydration.  DVT prophylaxis:  SCDs Code Status:  Full code Family Communication:  None at bedside Expected Discharge:  Home health PT likely tomorrow  Consultants:  Orthopedics  Procedures:    Antimicrobials: Anti-infectives (From admission, onward)   Start     Dose/Rate Route Frequency Ordered Stop   01/22/19 1050  ceFAZolin (ANCEF) 2-4 GM/100ML-% IVPB    Note to Pharmacy: Steffanie Dunn   : cabinet override      01/22/19 1050 01/22/19 1121   01/22/19 0600  ceFAZolin (ANCEF) IVPB 2g/100 mL premix     2 g 200 mL/hr over 30 Minutes Intravenous On call to O.R. 01/21/19 2204 01/22/19 1121        Code Status: Full Code   Diet Order            Diet regular Room service appropriate? Yes; Fluid consistency: Thin  Diet effective now              Infusions:  . sodium chloride 20 mL/hr at 01/23/19 1708  . methocarbamol (ROBAXIN) IV Stopped (01/22/19 1327)    Scheduled Meds: . acetaminophen  1,000 mg Oral Once  . aspirin EC  325 mg Oral Q breakfast  . docusate sodium  100 mg Oral  BID  . traMADol  50 mg Oral Q6H    PRN meds: acetaminophen, HYDROcodone-acetaminophen, HYDROcodone-acetaminophen, menthol-cetylpyridinium **OR** phenol, methocarbamol **OR** methocarbamol (ROBAXIN) IV, metoCLOPramide **OR** metoCLOPramide (REGLAN) injection, morphine injection, ondansetron **OR** ondansetron (ZOFRAN) IV   Objective: Vitals:   01/23/19 1714 01/23/19 1725  BP: (!) 107/54   Pulse: 74 75  Resp: 17   Temp: 98.5 F (36.9 C)   SpO2: (!) 81% 95%    Intake/Output Summary (Last  24 hours) at 01/23/2019 1759 Last data filed at 01/23/2019 1442 Gross per 24 hour  Intake 2871.83 ml  Output 1350 ml  Net 1521.83 ml   Filed Weights   01/21/19 1146 01/21/19 1700  Weight: 78 kg 73.9 kg   Weight change:  Body mass index is 28.86 kg/m.   Physical Exam: General exam: Appears calm and comfortable.  Skin: No rashes, lesions or ulcers. HEENT: Atraumatic, normocephalic, supple neck, no obvious bleeding Lungs: Clear to auscultation bilaterally CVS: Regular rate and rhythm, no murmur GI/Abd soft, nontender, nondistended, bowel sound present CNS: Alert, awake, oriented x3 Psychiatry: Mood appropriate Extremities: No pedal edema, no calf tenderness  Data Review: I have personally reviewed the laboratory data and studies available.  Recent Labs  Lab 01/21/19 1246 01/22/19 0308 01/23/19 0404  WBC 8.9 6.3 9.1  NEUTROABS 7.1  --   --   HGB 12.0 11.2* 9.7*  HCT 35.6* 34.4* 30.0*  MCV 84.2 87.5 88.0  PLT 254 240 214   Recent Labs  Lab 01/21/19 1246 01/21/19 1937 01/22/19 0308 01/23/19 0404  NA 136 138 139 135  K 2.7* 3.3* 3.8 3.5  CL 97* 99 101 101  CO2 28 27 28 25   GLUCOSE 102* 121* 156* 101*  BUN 24* 20 19 16   CREATININE 0.78 0.70 0.69 0.48  CALCIUM 9.2 9.0 9.1 8.4*  MG  --  1.9  --   --     Terrilee Croak, MD  [facility] 01/23/2019

## 2019-01-23 NOTE — Progress Notes (Signed)
Pt unable to void. Bladder scan = 268, on call provider informed. Awaiting new orders. Will CTM.

## 2019-01-23 NOTE — Plan of Care (Signed)

## 2019-01-23 NOTE — Evaluation (Signed)
Physical Therapy Evaluation Patient Details Name: Catherine SeedsKathleen Wong MRN: [id number] DOB: 02/17/1945 Today's Date: 01/23/2019   History of Present Illness  Pt is 73 yo female who fell 6 days prior to admission.  She has PMH of HTN, osteoporosis, RA, hx of breast CA, multiple R knee surgeries and locked in extension, and polio and wears brace on L LE.  Pt admitted with subcapital nondisplaced L hip fx.  She is s/p L hip bipolar hemiarthroplasty posterior approach on 01/22/19.  Clinical Impression  Pt is s/p posterior hemiarthroplasty on L resulting in the deficits listed below (see PT Problem List). Pt is complex case due to prior injuries/conditions.  Her L LE has 0/5 strength throughout due to post-polio syndrome and she wears a KAFO.  Unfortunately, she is unable to wear KAFO at this time due to edema.  Will continue knee immobilizer and focus on transfers until edema improves, pt can wear KAFO, then will add gait training.  Additionally, pt with multiple R knee surgeries and R knee is locked in extension.  Pt with excellent arm strength and very motivated and was able to complete transfers with min A.  Pt will benefit from mobility with w/c initially due to above conditions; however, would also benefit from a RW to complete transfers to w/c safely and to work on standing.  Pt prefers to ambulate with crutches but agrees to RW for transfers and standing.  Pt will benefit from skilled PT to increase their independence and safety with mobility to allow discharge to the venue listed below.      Follow Up Recommendations Home health PT    Equipment Recommendations  Rolling walker with 5" wheels(however likely purchase on their own as they are trying to get insurance to reimburse for w/c)    Recommendations for Other Services       Precautions / Restrictions Precautions Precautions: Posterior Hip;Fall Precaution Booklet Issued: Yes (comment) Required Braces or Orthoses: Knee Immobilizer - Left Knee  Immobilizer - Left: On at all times(can use pts KAFO when edema allows) Restrictions Weight Bearing Restrictions: Yes LLE Weight Bearing: Weight bearing as tolerated      Mobility  Bed Mobility Overal bed mobility: Needs Assistance Bed Mobility: Supine to Sit;Sit to Supine     Supine to sit: Min assist     General bed mobility comments: used UE to assist L LE;  multiple cues for hip precautions [facility])  Transfers Overall transfer level: Needs assistance Equipment used: 2 person hand held assist Transfers: Scientist, clinical (histocompatibility and immunogenetics)quat Pivot Transfers;Sit to/from Stand Sit to Stand: Min assist;+2 safety/equipment   Squat pivot transfers: +2 safety/equipment;Min assist     General transfer comment: Pt was able to partially stand and pivot to the right side to chair; used HHA of 2 and chair arm rest when pivoting; cued for hip precautions; Knee immobilizer on L LE but still not able to bear weight due to no ankle support/stability  Ambulation/Gait             General Gait Details: deferred due to edema in L LE and unable to wear KAFO (has knee immobililzer on but no ankle support)  Stairs            Wheelchair Mobility    Modified Rankin (Stroke Patients Only)       Balance Overall balance assessment: Needs assistance Sitting-balance support: Bilateral upper extremity supported;Feet supported Sitting balance-Leahy Scale: Good     Standing balance support: Bilateral upper extremity supported;During functional activity  Standing balance-Leahy Scale: Poor                               Pertinent Vitals/Pain Pain Assessment: 0-10 Pain Score: 8  Pain Location: L hip Pain Descriptors / Indicators: Discomfort Pain Intervention(s): Repositioned;Ice applied;Patient requesting pain meds-RN notified    Home Living Family/patient expects to be discharged to:: Other (Comment)(pt and friend staying at [facility] until finish closing on house) Living  Arrangements: Non-relatives/Friends Available Help at Discharge: Friend(s);Available 24 hours/day   Home Access: Level entry       Home Equipment: Crutches;Wheelchair - manual Additional Comments: Gravette with level entry, grab bars in bathroom, elevated toilet, tub/shower combo.    Prior Function Level of Independence: Independent with assistive device(s)         Comments: Prior to recent fall and fracture pt was independent with use of KAFO.  Since her fall she has been using w/c and crutches completing transfers with min A.  She fell in [place] 6 days ago and then they moved to [facility].     Hand Dominance        Extremity/Trunk Assessment   Upper Extremity Assessment Upper Extremity Assessment: Overall WFL for tasks assessed    Lower Extremity Assessment Lower Extremity Assessment: LLE deficits/detail;RLE deficits/detail RLE Deficits / Details: ankle ROM WFL and 5/5 MMT; knee locked in extension; hip WFL and at least 3/5 LLE Deficits / Details: PROM: WFL and hip precautions; MMT 0/5 - baseline from polio    Cervical / Trunk Assessment Cervical / Trunk Assessment: Normal  Communication   Communication: HOH  Cognition Arousal/Alertness: Awake/alert Behavior During Therapy: WFL for tasks assessed/performed Overall Cognitive Status: Within Functional Limits for tasks assessed                                 General Comments: limited by [facility]      General Comments General comments (skin integrity, edema, etc.): Pt's friend (who will be with her at d/c) - reports they have a w/c and are applying to have insurance cover it, but does feel pt would benefit from RW to assist with pivots-PT agrees    Exercises     Assessment/Plan    PT Assessment Patient needs continued PT services  PT Problem List Decreased strength;Decreased mobility;Decreased safety awareness;Decreased range of motion;Decreased knowledge of precautions;Decreased activity tolerance;Decreased  balance;Decreased knowledge of use of DME       PT Treatment Interventions DME instruction;Therapeutic activities;Modalities;Gait training;Therapeutic exercise;Patient/family education;Balance training;Functional mobility training    PT Goals (Current goals can be found in the Care Plan section)  Acute Rehab PT Goals Patient Stated Goal: return to the extended stay hotel they are staying at with supervision and HHPT PT Goal Formulation: With patient/family Time For Goal Achievement: 02/06/19 Potential to Achieve Goals: Good    Frequency 7X/week   Barriers to discharge        Co-evaluation               AM-PAC PT "6 Clicks" Mobility  Outcome Measure Help needed turning from your back to your side while in a flat bed without using bedrails?: A Little Help needed moving from lying on your back to sitting on the side of a flat bed without using bedrails?: A Little Help needed moving to and from a bed to a chair (including a wheelchair)?: A  Little Help needed standing up from a chair using your arms (e.g., wheelchair or bedside chair)?: A Little Help needed to walk in hospital room?: Total Help needed climbing 3-5 steps with a railing? : Total 6 Click Score: 14    End of Session Equipment Utilized During Treatment: Gait belt;Left knee immobilizer Activity Tolerance: Patient tolerated treatment well Patient left: in chair;with chair alarm set;with family/visitor present;with call bell/phone within reach Nurse Communication: Mobility status;Weight bearing status;Precautions(transfers to R with assist of 1) PT Visit Diagnosis: Other abnormalities of gait and mobility (R26.89);Muscle weakness (generalized) (M62.81)    Time: 2841-3244 PT Time Calculation (min) (ACUTE ONLY): 38 min   Charges:   PT Evaluation $PT Eval Moderate Complexity: 1 Mod PT Treatments $Therapeutic Activity: 8-22 mins        Royetta Asal, PT Acute Rehab Services Pager [phone] [facility]  Rehab [phone] [facility] [phone]   Rayetta Humphrey 01/23/2019, 1:49 PM

## 2019-01-24 ENCOUNTER — Inpatient Hospital Stay (HOSPITAL_COMMUNITY): Payer: Medicare Other

## 2019-01-24 ENCOUNTER — Encounter (HOSPITAL_COMMUNITY): Payer: Medicare Other

## 2019-01-24 DIAGNOSIS — J9601 Acute respiratory failure with hypoxia: Secondary | ICD-10-CM

## 2019-01-24 LAB — BASIC METABOLIC PANEL
Anion gap: 9 (ref 5–15)
BUN: 15 mg/dL (ref 8–23)
CO2: 24 mmol/L (ref 22–32)
Calcium: 8 mg/dL — ABNORMAL LOW (ref 8.9–10.3)
Chloride: 101 mmol/L (ref 98–111)
Creatinine, Ser: 0.52 mg/dL (ref 0.44–1.00)
GFR calc Af Amer: >60 mL/min (ref >60)
GFR calc non Af Amer: >60 mL/min (ref >60)
Glucose, Bld: 100 mg/dL — ABNORMAL HIGH (ref 70–99)
Potassium: 3.2 mmol/L — ABNORMAL LOW (ref 3.5–5.1)
Sodium: 134 mmol/L — ABNORMAL LOW (ref 135–145)

## 2019-01-24 LAB — CBC
HCT: 27.7 % — ABNORMAL LOW (ref 36.0–46.0)
Hemoglobin: 9.1 g/dL — ABNORMAL LOW (ref 12.0–15.0)
MCH: 29 pg (ref 26.0–34.0)
MCHC: 32.9 g/dL (ref 30.0–36.0)
MCV: 88.2 fL (ref 80.0–100.0)
Platelets: 206 10*3/uL (ref 150–400)
RBC: 3.14 MIL/uL — ABNORMAL LOW (ref 3.87–5.11)
RDW: 13.3 % (ref 11.5–15.5)
WBC: 7.6 10*3/uL (ref 4.0–10.5)
nRBC: 0 % (ref 0.0–0.2)

## 2019-01-24 MED ORDER — HEPARIN BOLUS VIA INFUSION
2000.0000 [IU] | Freq: Once | INTRAVENOUS | Status: AC
  Administered 2019-01-24: 2000 [IU] via INTRAVENOUS
  Filled 2019-01-24: qty 2000

## 2019-01-24 MED ORDER — HYDROCODONE-ACETAMINOPHEN 7.5-325 MG PO TABS: 1.0000 | ORAL_TABLET | Freq: Four times a day (QID) | ORAL | 0 refills | Status: AC | PRN

## 2019-01-24 MED ORDER — HEPARIN (PORCINE) 25000 UT/250ML-% IV SOLN
1000.0000 [IU]/h | INTRAVENOUS | Status: DC
  Administered 2019-01-24: 1000 [IU]/h via INTRAVENOUS
  Filled 2019-01-24: qty 250

## 2019-01-24 MED ORDER — IOHEXOL 350 MG/ML SOLN
100.0000 mL | Freq: Once | INTRAVENOUS | Status: AC | PRN
  Administered 2019-01-24: 100 mL via INTRAVENOUS

## 2019-01-24 MED ORDER — POTASSIUM CHLORIDE CRYS ER 20 MEQ PO TBCR
40.0000 meq | EXTENDED_RELEASE_TABLET | Freq: Once | ORAL | Status: AC
  Administered 2019-01-24: 40 meq via ORAL
  Filled 2019-01-24: qty 2

## 2019-01-24 MED ORDER — METHOCARBAMOL 500 MG PO TABS: 500.0000 mg | ORAL_TABLET | Freq: Four times a day (QID) | ORAL | 0 refills | Status: AC | PRN

## 2019-01-24 MED ORDER — ASPIRIN 325 MG PO TBEC: 325.0000 mg | DELAYED_RELEASE_TABLET | Freq: Every day | ORAL | 0 refills | Status: DC

## 2019-01-24 NOTE — Progress Notes (Addendum)
Subjective: Patient doing well.  Left hip pain controlled.  No specific complaints.   Objective: Vital signs in last 24 hours: Temp:  [97.7 F (36.5 C)-98.5 F (36.9 C)] 97.9 F (36.6 C) (12/08 0902) Pulse Rate:  [67-75] 71 (12/08 0902) Resp:  [16-18] 16 (12/08 0902) BP: (98-124)/(54-76) 121/63 (12/08 0902) SpO2:  [81 %-97 %] 97 % (12/08 0902)  Intake/Output from previous day: 12/07 0701 - 12/08 0700 In: 1373.4 [P.O.:300; I.V.:1073.4] Out: 600 [Urine:600] Intake/Output this shift: Total I/O In: -  Out: 350 [Urine:350]  Recent Labs    01/21/19 1246 01/22/19 0308 01/23/19 0404 01/24/19 0433  HGB 12.0 11.2* 9.7* 9.1*   Recent Labs    01/23/19 0404 01/24/19 0433  WBC 9.1 7.6  RBC 3.41* 3.14*  HCT 30.0* 27.7*  PLT 214 206   Recent Labs    01/23/19 0404 01/24/19 0433  NA 135 134*  K 3.5 3.2*  CL 101 101  CO2 25 24  BUN 16 15  CREATININE 0.48 0.52  GLUCOSE 101* 100*  CALCIUM 8.4* 8.0*   Recent Labs    01/21/19 1246  INR 1.0    Exam: Pleasant female, alert and oriented. NAD.  Hip wound looks good.  Staples intact.  No drainage or signs of infection.      Assessment/Plan: Continue present care. D/c planning per medicine service.   Stable from ortho standpoint.     Benjiman Core 01/24/2019, 10:35 AM

## 2019-01-24 NOTE — Progress Notes (Addendum)
Physical Therapy Treatment Patient Details Name: Catherine Wong MRN: [id number] DOB: [birth year] Today's Date: 01/24/2019    History of Present Illness Pt is 73 yo female who fell 6 days prior to admission.  She has PMH of HTN, osteoporosis, RA, hx of breast CA, multiple R knee surgeries and locked in extension, and polio and wears brace on L LE.  Pt admitted with subcapital nondisplaced L hip fx.  She is s/p L hip bipolar hemiarthroplasty posterior approach on 01/22/19.    PT Comments    Pt demonstrating improved transfers, but frequently needs cues for hip precautions (particularly no flexion past 90) and safe ways to perform transfers.  She was able to transfer with min A and tolerated standing and full stand pivot with min A.  She is not safe to ambulate at this time due to unable to wear KAFO.  O2 sats did decrease with activity on RA.    Follow Up Recommendations  Home health PT     Equipment Recommendations  Rolling walker with 5" wheels    Recommendations for Other Services       Precautions / Restrictions      Mobility  Bed Mobility         Supine to sit: Min guard     General bed mobility comments: used UE to assist L LE;  multiple cues for hip precautions (limited due to [facility])  Transfers Overall transfer level: Needs assistance Equipment used: Rolling walker (2 wheeled) Transfers: Sit to/from [facility] Sit to Stand: Min assist Stand pivot transfers: Min assist       General transfer comment: Pt cued for hip precautions with all transfers.  Performed sit to stand x 2 with cues for safe hand placement and RW.  Perfomed stand pivot to chair with min A for balance and holding onto armrest (declined trying with RW or crutches).  Transfers complicated and needs increased cues due to unable to flex bil knee and has hip precautions- good upper body strength to assist.  Ambulation/Gait             General Gait Details: deferred due to unable to  wear KAFO due to edema   Stairs             Wheelchair Mobility    Modified Rankin (Stroke Patients Only)       Balance Overall balance assessment: Needs assistance Sitting-balance support: No upper extremity supported;Feet supported Sitting balance-Leahy Scale: Good     Standing balance support: Bilateral upper extremity supported;During functional activity Standing balance-Leahy Scale: Fair                              Cognition Arousal/Alertness: Awake/alert Behavior During Therapy: WFL for tasks assessed/performed Overall Cognitive Status: Within Functional Limits for tasks assessed                                 General Comments: limited by [facility]      Exercises      General Comments General comments (skin integrity, edema, etc.): Perfomed standing (weight on R LE due to no KAFO on L) for 8 minutes.  Attempted with RW and with crutches.   Cued for safety.      Pertinent Vitals/Pain Pain Assessment: 0-10 Pain Score: 4  Pain Location: L hip Pain Descriptors / Indicators: Cramping Pain Intervention(s): Repositioned;Ice applied  Pt on 2 LPM O2 at arrival with sats 97% rest. Tried RA and at rest sats maintained 95%. On RA with activity sats down to 87% and took 30 sec to recover to 90% on 2 LPM O2. On 2 LPM O2 with activity sats 95%.    Home Living                      Prior Function            PT Goals (current goals can now be found in the care plan section) Progress towards PT goals: Progressing toward goals    Frequency    Min 5X/week      PT Plan Frequency needs to be updated    Co-evaluation              AM-PAC PT "6 Clicks" Mobility   Outcome Measure  Help needed turning from your back to your side while in a flat bed without using bedrails?: None Help needed moving from lying on your back to sitting on the side of a flat bed without using bedrails?: None Help needed moving to and from a  bed to a chair (including a wheelchair)?: A Little Help needed standing up from a chair using your arms (e.g., wheelchair or bedside chair)?: A Little Help needed to walk in hospital room?: A Lot Help needed climbing 3-5 steps with a railing? : Total 6 Click Score: 17    End of Session Equipment Utilized During Treatment: Gait belt;Left knee immobilizer Activity Tolerance: Patient tolerated treatment well Patient left: in chair;with chair alarm set;with call bell/phone within reach Nurse Communication: Mobility status(O2 dropped on RA) PT Visit Diagnosis: Other abnormalities of gait and mobility (R26.89);Muscle weakness (generalized) (M62.81)     Time: 4403-4742 PT Time Calculation (min) (ACUTE ONLY): 23 min  Charges:  $Therapeutic Activity: 23-37 mins                     Royetta Asal, PT Acute Rehab Services Pager [phone] [facility] [phone] [facility] [phone]    Rayetta Humphrey 01/24/2019, 1:55 PM

## 2019-01-24 NOTE — Progress Notes (Addendum)
PROGRESS NOTE  Catherine Wong  DOB: 05/17/1945  PCP: Patient, No Pcp Per [id number]  DOA: 01/21/2019  LOS: 3 days   Chief Complaint  Patient presents with  . Fall   Brief narrative: Catherine Wong is a 73 y.o. female with PMH of poliomyelitis as child with left leg chronic weakness, hypertension, osteoporosis, rheumatoid arthritis, hx of breast cancer. She presented to the ED on 12/5 after a fall 5 days ago leading to left hip pain. Patient has chronic left leg weakness secondary to poliomyelitis as a child.  She has been walking with a brace ever since she was 74 years old.   5 days ago, while preparing to move from [place] to here, patient had a mechanical fall and started hurting her left hip.  They travelled anyway.  She has been using a wheelchair since then.  Pain got worse despite using ibuprofen and has presented to the ED at [facility] on 12/5.  In the ED, patient is hemodynamically stable. X-ray left hip showed subcapital nondisplaced hip fracture. Blood work showed potassium level low at 2.7. Orthopedic surgery Dr. Lorin Mercy was consulted from ED. Patient admitted to hospitalist medicine service for further evaluation and management. -10/6, underwent left hip bipolar hemiarthroplasty by Dr. Lorin Mercy .  Subjective: Seen and examined this morning.  Elderly Caucasian female.  Propped up in bed.  Not in distress.  On low-flow oxygen.  Assessment/Plan: Left hip fracture -Mechanical fall leading to subcapital nondisplaced hip fracture. -10/6, underwent left hip bipolar hemiarthroplasty by Dr. Lorin Mercy . -Pain regimen, DVT prophylaxis per orthopedic surgery team. -Calcium and Vitamin D supplementation. -PT eval appreciated. Home health PT recommended.  Acute respiratory with hypoxia -Patient has been requiring low-flow oxygen postoperatively. Today on ambulation, she required 2 L by nasal cannula to maintain O2 saturation. -Patient also has history of long-term  smoking and probably underlying COPD. -Because of recent travel, I would like to rule out PE with a CT angio chest. -Wean down oxygen as tolerated.  May need oxygen at discharge.  She has a history of polio, breast cancer and is a long-term smoker so she probably has chronic lung disease as a qualifying diagnosis.  Left lower extremity swelling -Patient recently traveled from [place] with a fractured hip.  She has been less mobile since then. -Ultrasound duplex 12/6 was negative for DVT.  Postoperative drop in hemoglobin -Hemoglobin was 12 on presentation.  Dropped steadily down to 9.1 -No active bleeding.  Hemoglobin drop likely related to intraoperative loss as well as  dilution from hydration.   -repeat hemoglobin tomorrow.  Hypokalemia -Potassium level improved with replacement.  Chronic left leg weakness  -Secondary to poliomyelitis as a child.   Essential hypertension -Home meds include lisinopril and HCTZ.Both are currently on hold. -Can resume post discharge  Hyperlipidemia -statin  Breast cancer - in left breast in remission 19 years ago  Rheumatoid arthritis - only on ibuprofenas needed at home Mobility: Home health PT recommended Diet: Cardiac diet post procedure Fluid: Encourage oral hydration.  DVT prophylaxis:  SCDs Code Status:  Full code Family Communication:  None at bedside Expected Discharge:  Home health PT likely tomorrow.  May need home oxygen as well.  Consultants:  Orthopedics  Procedures:    Antimicrobials: Anti-infectives (From admission, onward)   Start     Dose/Rate Route Frequency Ordered Stop   01/22/19 1050  ceFAZolin (ANCEF) 2-4 GM/100ML-% IVPB    Note to Pharmacy: Caryl Pina   : cabinet  override      01/22/19 1050 01/22/19 1121   01/22/19 0600  ceFAZolin (ANCEF) IVPB 2g/100 mL premix     2 g 200 mL/hr over 30 Minutes Intravenous On call to O.R. 01/21/19 2204 01/22/19 1121        Code Status: Full Code   Diet  Order            Diet regular Room service appropriate? Yes; Fluid consistency: Thin  Diet effective now              Infusions:  . methocarbamol (ROBAXIN) IV Stopped (01/22/19 1327)    Scheduled Meds: . acetaminophen  1,000 mg Oral Once  . aspirin EC  325 mg Oral Q breakfast  . docusate sodium  100 mg Oral BID    PRN meds: acetaminophen, HYDROcodone-acetaminophen, menthol-cetylpyridinium **OR** phenol, methocarbamol **OR** methocarbamol (ROBAXIN) IV, metoCLOPramide **OR** metoCLOPramide (REGLAN) injection, morphine injection, ondansetron **OR** ondansetron (ZOFRAN) IV   Objective: Vitals:   01/24/19 0615 01/24/19 0902  BP: 124/76 121/63  Pulse: 71 71  Resp: 18 16  Temp: 98 F (36.7 C) 97.9 F (36.6 C)  SpO2: 94% 97%    Intake/Output Summary (Last 24 hours) at 01/24/2019 1631 Last data filed at 01/24/2019 1443 Gross per 24 hour  Intake 448.33 ml  Output 1000 ml  Net -551.67 ml   Filed Weights   01/21/19 1146 01/21/19 1700  Weight: 78 kg 73.9 kg   Weight change:  Body mass index is 28.86 kg/m.   Physical Exam: General exam: Appears calm and comfortable.  Skin: No rashes, lesions or ulcers. HEENT: Atraumatic, normocephalic, supple neck, no obvious bleeding Lungs: Clear to auscultate bilaterally CVS: Regular rate and rhythm, no murmur GI/Abd soft, nontender, nondistended, bowel sound present CNS: Alert, awake, oriented x3 Psychiatry: Mood appropriate Extremities: Pedal edema trace on left lower extremity  Data Review: I have personally reviewed the laboratory data and studies available.  Recent Labs  Lab 01/21/19 1246 01/22/19 0308 01/23/19 0404 01/24/19 0433  WBC 8.9 6.3 9.1 7.6  NEUTROABS 7.1  --   --   --   HGB 12.0 11.2* 9.7* 9.1*  HCT 35.6* 34.4* 30.0* 27.7*  MCV 84.2 87.5 88.0 88.2  PLT 254 240 214 206   Recent Labs  Lab 01/21/19 1246 01/21/19 1937 01/22/19 0308 01/23/19 0404 01/24/19 0433  NA 136 138 139 135 134*  K 2.7* 3.3* 3.8  3.5 3.2*  CL 97* 99 101 101 101  CO2 28 27 28 25 24   GLUCOSE 102* 121* 156* 101* 100*  BUN 24* 20 19 16 15   CREATININE 0.78 0.70 0.69 0.48 0.52  CALCIUM 9.2 9.0 9.1 8.4* 8.0*  MG  --  1.9  --   --   --     , MD  [facility] 01/24/2019

## 2019-01-24 NOTE — Care Management Important Message (Signed)
Important Message  Patient Details IM Letter given to [place] Case Manager to present to the Patient Name: Catherine Wong MRN: [id number] Date of Birth: [birth year]   Medicare Important Message Given:  Yes     Kerin Salen 01/24/2019, 10:51 AM

## 2019-01-24 NOTE — Plan of Care (Signed)

## 2019-01-24 NOTE — Progress Notes (Signed)
ANTICOAGULATION CONSULT NOTE - Initial Consult  Pharmacy Consult for IV heparin  Indication: pulmonary embolus  No Known Allergies  Patient Measurements: Height: 5\' 3"  (160 cm) Weight: 162 lb 14.7 oz (73.9 kg) IBW/kg (Calculated) : 52.4 Heparin Dosing Weight: 68.1  Vital Signs: Temp: 97.9 F (36.6 C) (12/08 0902) Temp Source: Oral (12/08 0902) BP: 121/63 (12/08 0902) Pulse Rate: 71 (12/08 0902)  Labs: Recent Labs    01/22/19 0308 01/23/19 0404 01/24/19 0433  HGB 11.2* 9.7* 9.1*  HCT 34.4* 30.0* 27.7*  PLT 240 214 206  CREATININE 0.69 0.48 0.52    Estimated Creatinine Clearance: 59.4 mL/min (by C-G formula based on SCr of 0.52 mg/dL).   Medical History: Past Medical History:  Diagnosis Date  . Hypertension   . Osteoporosis   . Rheumatoid arthritis (HCC)     Medications:  Scheduled:  . acetaminophen  1,000 mg Oral Once  . aspirin EC  325 mg Oral Q breakfast  . docusate sodium  100 mg Oral BID   Infusions:  . methocarbamol (ROBAXIN) IV Stopped (01/22/19 1327)    Assessment: 73 yo female presented to hospital after fall now found to have bilateral PE to start IV heparin with plan to likely change to oral agent tomorrow per Md discussion. Baseline labs done. Not on any anticoag meds prior to admission.   Goal of Therapy:  Heparin level 0.3-0.7 units/ml Monitor platelets by anticoagulation protocol: Yes   Plan:  1) IV heparin 2000 unit bolus then 2) IV heparin 1000 units/hr  3) Check heparin level 8 hours after start of IV heparin 4) Daily CBC  Kara Mead 01/24/2019,5:41 PM

## 2019-01-24 NOTE — Progress Notes (Signed)
Patient has a history of polio, breast cancer and is a long-term smoker. She likely has underlying COPD and restrictive lung disease as well. Currently requiring oxygen low flow via nasal cannula.

## 2019-01-24 NOTE — Discharge Instructions (Addendum)
INSTRUCTIONS AFTER LEFT HIP HEMIARTHROPLASTY JOINT REPLACEMENT   o Remove items at home which could result in a fall. This includes throw rugs or furniture in walking pathways o ICE to the affected joint every three hours while awake for 30 minutes at a time, for at least the first 3-5 days, and then as needed for pain and swelling.  Continue to use ice for pain and swelling. You may notice swelling that will progress down to the foot and ankle.  This is normal after surgery.  Elevate your leg when you are not up walking on it.   o Continue to use the breathing machine you got in the hospital (incentive spirometer) which will help keep your temperature down.  It is common for your temperature to cycle up and down following surgery, especially at night when you are not up moving around and exerting yourself.  The breathing machine keeps your lungs expanded and your temperature down.   DIET:  As you were doing prior to hospitalization, we recommend a well-balanced diet.  DRESSING / WOUND CARE / SHOWERING  You may change your dressing 3-5 days after surgery.  Then change the dressing every day with sterile gauze.  Please use good hand washing techniques before changing the dressing.  Do not use any lotions or creams on the incision until instructed by your surgeon.  ACTIVITY  o Increase activity slowly as tolerated, but follow the weight bearing instructions below.   o No driving for 6 weeks or until further direction given by your physician.  You cannot drive while taking narcotics.  o No lifting or carrying greater than 10 lbs. until further directed by your surgeon. o Avoid periods of inactivity such as sitting longer than an hour when not asleep. This helps prevent blood clots.  o You may return to work once you are authorized by your doctor.     WEIGHT BEARING   Weight bearing as tolerated with assist device (walker, cane, etc) as directed, use it as long as suggested by your surgeon or  therapist, typically at least 4-6 weeks.   EXERCISES  Results after joint replacement surgery are often greatly improved when you follow the exercise, range of motion and muscle strengthening exercises prescribed by your doctor. Safety measures are also important to protect the joint from further injury. Any time any of these exercises cause you to have increased pain or swelling, decrease what you are doing until you are comfortable again and then slowly increase them. If you have problems or questions, call your caregiver or physical therapist for advice.   Rehabilitation is important following a joint replacement. After just a few days of immobilization, the muscles of the leg can become weakened and shrink (atrophy).  These exercises are designed to build up the tone and strength of the thigh and leg muscles and to improve motion. Often times heat used for twenty to thirty minutes before working out will loosen up your tissues and help with improving the range of motion but do not use heat for the first two weeks following surgery (sometimes heat can increase post-operative swelling).   These exercises can be done on a training (exercise) mat, on the floor, on a table or on a bed. Use whatever works the best and is most comfortable for you.    Use music or television while you are exercising so that the exercises are a pleasant break in your day. This will make your life better with the exercises acting  as a break in your routine that you can look forward to.   Perform all exercises about fifteen times, three times per day or as directed.  You should exercise both the operative leg and the other leg as well.  Exercises include:    Quad Sets - Tighten up the muscle on the front of the thigh (Quad) and hold for 5-10 seconds.    Straight Leg Raises - With your knee straight (if you were given a brace, keep it on), lift the leg to 60 degrees, hold for 3 seconds, and slowly lower the leg.  Perform  this exercise against resistance later as your leg gets stronger.   Leg Slides: Lying on your back, slowly slide your foot toward your buttocks, bending your knee up off the floor (only go as far as is comfortable). Then slowly slide your foot back down until your leg is flat on the floor again.   Angel Wings: Lying on your back spread your legs to the side as far apart as you can without causing discomfort.   Hamstring Strength:  Lying on your back, push your heel against the floor with your leg straight by tightening up the muscles of your buttocks.  Repeat, but this time bend your knee to a comfortable angle, and push your heel against the floor.  You may put a pillow under the heel to make it more comfortable if necessary.   A rehabilitation program following joint replacement surgery can speed recovery and prevent re-injury in the future due to weakened muscles. Contact your doctor or a physical therapist for more information on knee rehabilitation.    CONSTIPATION  Constipation is defined medically as fewer than three stools per week and severe constipation as less than one stool per week.  Even if you have a regular bowel pattern at home, your normal regimen is likely to be disrupted due to multiple reasons following surgery.  Combination of anesthesia, postoperative narcotics, change in appetite and fluid intake all can affect your bowels.   YOU MUST use at least one of the following options; they are listed in order of increasing strength to get the job done.  They are all available over the counter, and you may need to use some, POSSIBLY even all of these options:    Drink plenty of fluids (prune juice may be helpful) and high fiber foods Colace 100 mg by mouth twice a day  Senokot for constipation as directed and as needed Dulcolax (bisacodyl), take with full glass of water  Miralax (polyethylene glycol) once or twice a day as needed.  If you have tried all these things and are  unable to have a bowel movement in the first 3-4 days after surgery call either your surgeon or your primary doctor.    If you experience loose stools or diarrhea, hold the medications until you stool forms back up.  If your symptoms do not get better within 1 week or if they get worse, check with your doctor.  If you experience "the worst abdominal pain ever" or develop nausea or vomiting, please contact the office immediately for further recommendations for treatment.   ITCHING:  If you experience itching with your medications, try taking only a single pain pill, or even half a pain pill at a time.  You can also use Benadryl over the counter for itching or also to help with sleep.   TED HOSE STOCKINGS:  Use stockings on both legs until for at least 2  weeks or as directed by physician office. They may be removed at night for sleeping.  MEDICATIONS:  See your medication summary on the After Visit Summary that nursing will review with you.  You may have some home medications which will be placed on hold until you complete the course of blood thinner medication.  It is important for you to complete the blood thinner medication as prescribed.  PRECAUTIONS:  If you experience chest pain or shortness of breath - call 911 immediately for transfer to the hospital emergency department.   If you develop a fever greater that 101 F, purulent drainage from wound, increased redness or drainage from wound, foul odor from the wound/dressing, or calf pain - CONTACT YOUR SURGEON.                                                   FOLLOW-UP APPOINTMENTS:  If you do not already have a post-op appointment, please call the office for an appointment to be seen by your surgeon.  Guidelines for how soon to be seen are listed in your After Visit Summary, but are typically between 1-4 weeks after surgery.  OTHER INSTRUCTIONS:   Knee Replacement:  Do not place pillow under knee, focus on keeping the knee straight while  resting. CPM instructions: 0-90 degrees, 2 hours in the morning, 2 hours in the afternoon, and 2 hours in the evening. Place foam block, curve side up under heel at all times except when in CPM or when walking.  DO NOT modify, tear, cut, or change the foam block in any way.  MAKE SURE YOU:   Understand these instructions.   Get help right away if you are not doing well or get worse.    Thank you for letting us be a part of your medical care team.  It is a privilege we respect greatly.  We hope these instructions will help you stay on track for a fast and full recovery!  __________________________________________________  Information on my medicine - ELIQUIS (apixaban)  This medication education was reviewed with me or my healthcare representative as part of my discharge preparation.   Why was Eliquis prescribed for you? Eliquis was prescribed to treat blood clots that may have been found in the veins of your legs (deep vein thrombosis) or in your lungs (pulmonary embolism) and to reduce the risk of them occurring again.  What do You need to know about Eliquis ? The starting dose is 10 mg (two 5 mg tablets) taken TWICE daily for the FIRST SEVEN (7) DAYS, then on 02/02/2019  the dose is reduced to ONE 5 mg tablet taken TWICE daily.  Eliquis may be taken with or without food.   Try to take the dose about the same time in the morning and in the evening. If you have difficulty swallowing the tablet whole please discuss with your pharmacist how to take the medication safely.  Take Eliquis exactly as prescribed and DO NOT stop taking Eliquis without talking to the doctor who prescribed the medication.  Stopping may increase your risk of developing a new blood clot.  Refill your prescription before you run out.  After discharge, you should have regular check-up appointments with your healthcare provider that is prescribing your Eliquis.    What do you do if you miss a dose? If a dose  of  ELIQUIS is not taken at the scheduled time, take it as soon as possible on the same day and twice-daily administration should be resumed. The dose should not be doubled to make up for a missed dose.  Important Safety Information A possible side effect of Eliquis is bleeding. You should call your healthcare provider right away if you experience any of the following: ? Bleeding from an injury or your nose that does not stop. ? Unusual colored urine (red or dark brown) or unusual colored stools (red or black). ? Unusual bruising for unknown reasons. ? A serious fall or if you hit your head (even if there is no bleeding).  Some medicines may interact with Eliquis and might increase your risk of bleeding or clotting while on Eliquis. To help avoid this, consult your healthcare provider or pharmacist prior to using any new prescription or non-prescription medications, including herbals, vitamins, non-steroidal anti-inflammatory drugs (NSAIDs) and supplements.  This website has more information on Eliquis (apixaban): http://www.eliquis.com/eliquis/home

## 2019-01-24 NOTE — TOC Initial Note (Signed)
Transition of Care [facility]) - Initial/Assessment Note    Patient Details  Name: Catherine Wong MRN: [id number] Date of Birth: 12-22-1945  [facility]) CM/SW Contact:    Lia Hopping, [place] Phone Number: 01/24/2019, 4:30 PM  Clinical Narrative:   Patient had a mechanical fall and started having hip pain. Patient admitted for surgical intervention left hip bipolar hemiarthroplasty. Patient recently moved from [place] and has temporarily been staying at Extended Stay in [place] until her home is ready in [place].               CSW met with the patient and her friend at bedside to discuss discharge needs. CSW discussed Home Health agencies with the patient and provided a list from [email or web address]. Patient chose [place] ([facility]). CSW confirm staff availability. RW ordered through [place]. Patient declines a 3 IN  Patient is currently requiring O2 and likely discharge on oxygen. The physician has ordered an additional work up to determine patient need for oxygen.  TOC staff will continue to follow for d/c needs.   Expected Discharge Plan: [place] Barriers to Discharge: No Barriers Identified   Patient Goals and CMS Choice   CMS Medicare.gov Compare Post Acute Care list provided to:: Patient Choice offered to / list presented to : Patient  Expected Discharge Plan and Services Expected Discharge Plan: [place] In-house Referral: Clinical Social Work Discharge Planning Services: CM Consult Post Acute Care Choice: [place] arrangements for the past 2 months: [place], Hotel/Motel                 DME Arranged: Oxygen, Walker rolling(Patient declines 3 IN 1) DME Agency: [employer] Date DME Agency Contacted: 01/24/19 Time DME Agency Contacted: [phone] Representative spoke with at DME Agency: [place]: PT, OT [place] Agency: [place] ([place]) Date [place]:  01/24/19 Time [place]: 1527 Representative spoke with at [place]: Santiago Glad  Prior Living Arrangements/Services Living arrangements for the past 2 months: Single Family Home, Hotel/Motel Lives with:: Self   Do you feel safe going back to the place where you live?: Yes      Need for Family Participation in Patient Care: Yes (Comment) Care giver support system in place?: Yes (comment)   Criminal Activity/Legal Involvement Pertinent to Current Situation/Hospitalization: No - Comment as needed  Activities of Daily Living Home Assistive Devices/Equipment: Crutches ADL Screening (condition at time of admission) Patient's cognitive ability adequate to safely complete daily activities?: Yes Is the patient deaf or have difficulty hearing?: No Does the patient have difficulty seeing, even when wearing glasses/contacts?: No Does the patient have difficulty concentrating, remembering, or making decisions?: No Patient able to express need for assistance with ADLs?: Yes Does the patient have difficulty dressing or bathing?: Yes Independently performs ADLs?: No Toileting: Needs assistance Does the patient have difficulty walking or climbing stairs?: Yes Weakness of Legs: Both Weakness of Arms/Hands: Both  Permission Sought/Granted Permission sought to share information with : Family Supports Permission granted to share information with : Yes, Verbal Permission Granted     Permission granted to share info w AGENCY: Home Health        Emotional Assessment Appearance:: Appears stated age   Affect (typically observed): Accepting, Pleasant Orientation: : Oriented to Self, Oriented to Place, Oriented to  Time, Oriented to Situation Alcohol / Substance Use: Not Applicable Psych Involvement: No (comment)  Admission diagnosis:  Hypokalemia [  E87.6] Pre-op exam [Z01.818] Closed subcapital fracture of left femur, initial encounter [facility]) [S72.012A] Patient Active Problem List    Diagnosis Date Noted  . Closed subcapital fracture of left femur ([place])   . Hip fracture ([place]) 01/21/2019  . Hip fracture, unspecified laterality, closed, initial encounter ([place]) 01/21/2019  . Essential hypertension 01/21/2019  . Fall at home, initial encounter 01/21/2019  . Osteoporosis 01/21/2019  . Hypokalemia 01/21/2019   PCP:  Patient, No Pcp Per Pharmacy:   [facility] [place], [place] [address] [place] [address] Phone: [phone] Fax: [phone]     Social Determinants of Health (SDOH) Interventions    Readmission Risk Interventions No flowsheet data found.

## 2019-01-24 NOTE — Progress Notes (Signed)
CT Angio was done this afternoon. Results showed bilateral pulmonary embolism.  US duplex negative for DVT.  We will start IV heparin drip with a plan to transition to oral anticoagulant tomorrow. I discussed the findings and plan with patient, nurse and the pharmacist. d/w pccm as suggested by the radiologist..

## 2019-01-25 ENCOUNTER — Encounter (HOSPITAL_COMMUNITY): Payer: Self-pay

## 2019-01-25 DIAGNOSIS — I2609 Other pulmonary embolism with acute cor pulmonale: Secondary | ICD-10-CM

## 2019-01-25 LAB — BPAM RBC
Blood Product Expiration Date: 202101022359
Blood Product Expiration Date: 202101092359
Blood Product Expiration Date: 202101122359
Unit Type and Rh: 9500
Unit Type and Rh: 9500
Unit Type and Rh: 9500

## 2019-01-25 LAB — CBC
HCT: 28.7 % — ABNORMAL LOW (ref 36.0–46.0)
Hemoglobin: 9.3 g/dL — ABNORMAL LOW (ref 12.0–15.0)
MCH: 28.5 pg (ref 26.0–34.0)
MCHC: 32.4 g/dL (ref 30.0–36.0)
MCV: 88 fL (ref 80.0–100.0)
Platelets: 233 10*3/uL (ref 150–400)
RBC: 3.26 MIL/uL — ABNORMAL LOW (ref 3.87–5.11)
RDW: 13.3 % (ref 11.5–15.5)
WBC: 7.6 10*3/uL (ref 4.0–10.5)
nRBC: 0 % (ref 0.0–0.2)

## 2019-01-25 LAB — BASIC METABOLIC PANEL
Anion gap: 7 (ref 5–15)
BUN: 15 mg/dL (ref 8–23)
CO2: 25 mmol/L (ref 22–32)
Calcium: 8.2 mg/dL — ABNORMAL LOW (ref 8.9–10.3)
Chloride: 102 mmol/L (ref 98–111)
Creatinine, Ser: 0.48 mg/dL (ref 0.44–1.00)
GFR calc Af Amer: >60 mL/min (ref >60)
GFR calc non Af Amer: >60 mL/min (ref >60)
Glucose, Bld: 114 mg/dL — ABNORMAL HIGH (ref 70–99)
Potassium: 3.5 mmol/L (ref 3.5–5.1)
Sodium: 134 mmol/L — ABNORMAL LOW (ref 135–145)

## 2019-01-25 LAB — TYPE AND SCREEN
ABO/RH(D): O NEG
Antibody Screen: POSITIVE
Donor AG Type: NEGATIVE
Donor AG Type: NEGATIVE
Donor AG Type: NEGATIVE
PT AG Type: NEGATIVE
Unit division: 0
Unit division: 0
Unit division: 0

## 2019-01-25 LAB — HEPARIN LEVEL (UNFRACTIONATED)
Heparin Unfractionated: 0.17 IU/mL — ABNORMAL LOW (ref 0.30–0.70)
Heparin Unfractionated: 0.44 IU/mL (ref 0.30–0.70)
Heparin Unfractionated: 0.23 IU/mL — ABNORMAL LOW (ref 0.30–0.70)

## 2019-01-25 MED ORDER — POLYETHYLENE GLYCOL 3350 17 G PO PACK
17.0000 g | PACK | Freq: Two times a day (BID) | ORAL | Status: AC
  Administered 2019-01-25 – 2019-01-26 (×3): 17 g via ORAL
  Filled 2019-01-25 (×3): qty 1

## 2019-01-25 MED ORDER — HEPARIN (PORCINE) 25000 UT/250ML-% IV SOLN
1200.0000 [IU]/h | INTRAVENOUS | Status: DC
  Administered 2019-01-25 (×2): 1200 [IU]/h via INTRAVENOUS
  Filled 2019-01-25 (×3): qty 250

## 2019-01-25 MED ORDER — HEPARIN BOLUS VIA INFUSION
2000.0000 [IU] | INTRAVENOUS | Status: AC
  Administered 2019-01-25: 2000 [IU] via INTRAVENOUS
  Filled 2019-01-25: qty 2000

## 2019-01-25 MED ORDER — HEPARIN (PORCINE) 25000 UT/250ML-% IV SOLN
1500.0000 [IU]/h | INTRAVENOUS | Status: AC
  Administered 2019-01-26: 1500 [IU]/h via INTRAVENOUS
  Filled 2019-01-25 (×2): qty 250

## 2019-01-25 NOTE — Progress Notes (Signed)
ANTICOAGULATION CONSULT NOTE - Initial Consult  Pharmacy Consult for IV heparin  Indication: pulmonary embolus  No Known Allergies  Patient Measurements: Height: 5\' 3"  (160 cm) Weight: 162 lb 14.7 oz (73.9 kg) IBW/kg (Calculated) : 52.4 Heparin Dosing Weight: 68.1  Vital Signs: Temp: 98.1 F (36.7 C) (12/08 2211) BP: 114/66 (12/08 2211) Pulse Rate: 77 (12/08 2211)  Labs: Recent Labs    01/23/19 0404 01/24/19 0433 01/25/19 0144  HGB 9.7* 9.1* 9.3*  HCT 30.0* 27.7* 28.7*  PLT 214 206 233  HEPARINUNFRC  --   --  0.17*  CREATININE 0.48 0.52 0.48    Estimated Creatinine Clearance: 59.4 mL/min (by C-G formula based on SCr of 0.48 mg/dL).   Medical History: Past Medical History:  Diagnosis Date  . Hypertension   . Osteoporosis   . Rheumatoid arthritis (HCC)     Medications:  Scheduled:  . acetaminophen  1,000 mg Oral Once  . docusate sodium  100 mg Oral BID  . heparin  2,000 Units Intravenous NOW   Infusions:  . heparin    . methocarbamol (ROBAXIN) IV Stopped (01/22/19 1327)    Assessment: 73 yo female presented to hospital after fall now found to have bilateral PE to start IV heparin with plan to likely change to oral agent tomorrow per Md discussion. Baseline labs done. Not on any anticoag meds prior to admission.    0144 HL = 0.17 with heparin infusing @ 1000 units/hr  No complications of therapy noted  Goal of Therapy:  Heparin level 0.3-0.7 units/ml Monitor platelets by anticoagulation protocol: Yes   Plan:  1) Repeat heparin 2000 unit bolus x 1 then 2) Increase IV heparin to 1200 units/hr  3) Check heparin level 8 hours after heparin rate increase 4) Daily CBC 5) Follow up plan for transition to oral anticoagulation  Tateanna Bach, Toribio Harbour, PharmD 01/25/2019,2:50 AM

## 2019-01-25 NOTE — Progress Notes (Signed)
Catherine Wong for IV heparin  Indication: pulmonary embolus  No Known Allergies  Patient Measurements: Height: 5\' 3"  (160 cm) Weight: 162 lb 14.7 oz (73.9 kg) IBW/kg (Calculated) : 52.4 Heparin Dosing Weight: 68.1  Vital Signs: Temp: 98.2 F (36.8 C) (12/09 0552) BP: 120/69 (12/09 0552) Pulse Rate: 71 (12/09 0552)  Labs: Recent Labs    01/23/19 0404 01/24/19 0433 01/25/19 0144 01/25/19 1113  HGB 9.7* 9.1* 9.3*  --   HCT 30.0* 27.7* 28.7*  --   PLT 214 206 233  --   HEPARINUNFRC  --   --  0.17* 0.44  CREATININE 0.48 0.52 0.48  --    Estimated Creatinine Clearance: 59.4 mL/min (by C-G formula based on SCr of 0.48 mg/dL).  Medical History: Past Medical History:  Diagnosis Date  . Hypertension   . Osteoporosis   . Rheumatoid arthritis (HCC)    Medications:  Scheduled:  . acetaminophen  1,000 mg Oral Once  . docusate sodium  100 mg Oral BID   Infusions:  . heparin 1,200 Units/hr (01/25/19 0515)  . methocarbamol (ROBAXIN) IV Stopped (01/22/19 1327)   Assessment: 73 yo female presented to hospital after fall now found to have bilateral PE to start IV heparin with plan to likely change to oral agent tomorrow per Md discussion. Baseline labs done. Not on any anticoag meds prior to admission.  - started Heparin 12/8 with 2000 unit bolus, 1000 units/hr  Goal of Therapy:  Heparin level 0.3-0.7 units/ml Monitor platelets by anticoagulation protocol: Yes   Today, 01/25/2019 0144 hep level = 0.17 units/ml, rebolus 2000 units, increase rate to 1200 units/hr 1113 Hep level = 0.44 units/ml on 1200 units/hr   Plan:  Continue Heparin at 1200 units/hr Recheck Heparin level at 8pm Daily CBC, daily heparin level Follow up plan for transition to oral anticoagulation  Minda Ditto, PharmD 01/25/2019,11:33 AM

## 2019-01-25 NOTE — Plan of Care (Signed)

## 2019-01-25 NOTE — Progress Notes (Signed)
Physical Therapy Treatment Patient Details Name: Catherine Wong MRN: [id number] DOB: [birth year] Today's Date: 01/25/2019    History of Present Illness Pt is 73 yo female who fell 6 days prior to admission.  She has PMH of HTN, osteoporosis, RA, hx of breast CA, multiple R knee surgeries and locked in extension, and polio and wears brace on L LE.  Pt admitted with subcapital nondisplaced L hip fx.  She is s/p L hip bipolar hemiarthroplasty posterior approach on 01/22/19.  Pt had CT on 01/24/19 that revealed bil PE and she was started on heparin drip.  Has not been on drip 24 hours, but heparin labs .56 and pt cleared for activity and stable per MD.    PT Comments    Pt demonstrates improved transfers but continues to need cues for hip precautions (particularly flexion when scooting to EOB).  She did attempt transfer with RW today and was able to perform safely with RW braced with sit to stand ; however, pt prefers to not use the RW.    Follow Up Recommendations  Home health PT     Equipment Recommendations  Rolling walker with 5" wheels((discussed at eval would likely want to get on her own because insurance getting w/c))    Recommendations for Other Services       Precautions / Restrictions Precautions Precautions: Posterior Hip;Fall Required Braces or Orthoses: Knee Immobilizer - Left Knee Immobilizer - Left: On when out of bed or walking(had KAFO but too much edema to wear) Restrictions LLE Weight Bearing: Weight bearing as tolerated    Mobility  Bed Mobility Overal bed mobility: Needs Assistance Bed Mobility: Supine to Sit     Supine to sit: Min guard     General bed mobility comments: used UE to assist L LE;  multiple cues for hip precautions (limited due to [facility])  Transfers Overall transfer level: Needs assistance Equipment used: Rolling walker (2 wheeled) Transfers: Sit to/from [facility] Sit to Stand: Min assist Stand pivot transfers: Min guard        General transfer comment: Pt cued for hip precautions with all transfers.  Cued for safe handplacement and required RW braced for sit to stand.  She was able to pivot to chair with RW and min guard.  Ambulation/Gait             General Gait Details: deferred due to unable to wear KAFO due to edema   Stairs             Wheelchair Mobility    Modified Rankin (Stroke Patients Only)       Balance Overall balance assessment: Needs assistance Sitting-balance support: No upper extremity supported;Feet supported Sitting balance-Leahy Scale: Good     Standing balance support: Bilateral upper extremity supported;During functional activity Standing balance-Leahy Scale: Fair                              Cognition Arousal/Alertness: Awake/alert Behavior During Therapy: WFL for tasks assessed/performed Overall Cognitive Status: Within Functional Limits for tasks assessed                                 General Comments: limited by [facility]      Exercises      General Comments General comments (skin integrity, edema, etc.): Pt not able to participate in exercises due to L LE paralyzed and R  knee locked in ext      Pertinent Vitals/Pain Pain Assessment: No/denies pain    Home Living                      Prior Function            PT Goals (current goals can now be found in the care plan section) Progress towards PT goals: Progressing toward goals    Frequency    Min 5X/week      PT Plan Current plan remains appropriate    Co-evaluation              AM-PAC PT "6 Clicks" Mobility   Outcome Measure  Help needed turning from your back to your side while in a flat bed without using bedrails?: None Help needed moving from lying on your back to sitting on the side of a flat bed without using bedrails?: None Help needed moving to and from a bed to a chair (including a wheelchair)?: A Little Help needed standing up  from a chair using your arms (e.g., wheelchair or bedside chair)?: A Little Help needed to walk in hospital room?: A Lot Help needed climbing 3-5 steps with a railing? : Total 6 Click Score: 17    End of Session Equipment Utilized During Treatment: Gait belt;Left knee immobilizer Activity Tolerance: Patient tolerated treatment well Patient left: in chair;with chair alarm set;with call bell/phone within reach Nurse Communication: Mobility status PT Visit Diagnosis: Other abnormalities of gait and mobility (R26.89);Muscle weakness (generalized) (M62.81)     Time: 7017-7939 PT Time Calculation (min) (ACUTE ONLY): 17 min  Charges:  $Therapeutic Activity: 8-22 mins                     Royetta Asal, PT Acute Rehab Services Pager [phone] [facility] [phone] [facility] [phone]    Rayetta Humphrey 01/25/2019, 4:25 PM

## 2019-01-25 NOTE — Progress Notes (Signed)
PROGRESS NOTE  Catherine Wong  DOB: 10-12-1945  PCP: Patient, No Pcp Per QVZ:[id number]  DOA: 01/21/2019  LOS: 4 days   Chief Complaint  Patient presents with  . Fall   Brief narrative:  Catherine Wong is a 73 y.o. female with PMH of poliomyelitis as child with left leg chronic weakness, hypertension, osteoporosis, rheumatoid arthritis, hx of breast cancer. She presented to the ED on 12/5 after a fall 5 days ago leading to left hip pain. Patient has chronic left leg weakness secondary to poliomyelitis as a child.  She has been walking with a brace ever since she was 73 years old.   5 days ago, while preparing to move from [place] to here, patient had a mechanical fall and started hurting her left hip.  They travelled anyway.  She has been using a wheelchair since then.  Pain got worse despite using ibuprofen and has presented to the ED at [facility] on 12/5.  Her work-up significant for left hip fracture, she went for surgical repair by Dr. Lorin Mercy 12/6, she was noted to be hypoxic with oxygen requirement, CTA chest significant for PE.  Subjective:  Patient herself denies any complaints, reports dyspnea is minimal, denies any chest pain, and hip pain is controlled.  Assessment/Plan:  Left hip fracture -Mechanical fall leading to subcapital nondisplaced hip fracture. -12/6, underwent left hip bipolar hemiarthroplasty by Dr. Lorin Mercy . -Calcium and Vitamin D supplementation. -PT eval appreciated. Home health PT recommended.  Acute respiratory with hypoxia/acute PE -Patient has been requiring low-flow oxygen postoperatively. Today on ambulation, she required 2 L by nasal cannula to maintain O2 saturation. -CTA chest significant for PE, this is most likely present on admission,  provoked PE in the setting of her left hip fracture, and traveling to [place] long distance despite that,  - this is second episode of DVT, she had right lower extremity DVT 2007 status post right  knee surgery. -CTA chest with evidence of right heart strain, so we will continue with IV heparin for 24 to 48 hours, then will transition to Eliquis. -She remains on 2 L nasal cannula, wean as tolerated.  Left lower extremity swelling -Patient recently traveled from [place] with a fractured hip.  She has been less mobile since then. -Ultrasound duplex 12/6 was negative for DVT.  Acute blood loss anemia -Postoperative, expected, continue with iron supplement, transfuse as needed, monitor closely as she is on full anticoagulation as well  Hypokalemia -Potassium level improved with replacement.  Chronic left leg weakness  -Secondary to poliomyelitis as a child.   Essential hypertension -Home meds include lisinopril and HCTZ.Both are currently on hold. -Can resume post discharge  Hyperlipidemia -statin  Breast cancer - in left breast in remission 19 years ago  Rheumatoid arthritis - only on ibuprofenas needed at home   Code Status:  Full code Family Communication:  Discussed with patient Expected Discharge:  Home when stable DVT prophylaxis:  on Heparin GTT Consultants:  Orthopedics  Procedures: -12/6, underwent left hip bipolar hemiarthroplasty by Dr. Lorin Mercy .  Antimicrobials: Anti-infectives (From admission, onward)   Start     Dose/Rate Route Frequency Ordered Stop   01/22/19 1050  ceFAZolin (ANCEF) 2-4 GM/100ML-% IVPB    Note to Pharmacy: Caryl Pina   : cabinet override      01/22/19 1050 01/22/19 1121   01/22/19 0600  ceFAZolin (ANCEF) IVPB 2g/100 mL premix     2 g 200 mL/hr over 30 Minutes Intravenous On  call to O.R. 01/21/19 2204 01/22/19 1121        Code Status: Full Code   Diet Order            Diet regular Room service appropriate? Yes; Fluid consistency: Thin  Diet effective now              Infusions:  . heparin 1,200 Units/hr (01/25/19 0515)  . methocarbamol (ROBAXIN) IV Stopped (01/22/19 1327)    Scheduled Meds: .  acetaminophen  1,000 mg Oral Once  . docusate sodium  100 mg Oral BID  . polyethylene glycol  17 g Oral BID    PRN meds: acetaminophen, HYDROcodone-acetaminophen, menthol-cetylpyridinium **OR** phenol, methocarbamol **OR** methocarbamol (ROBAXIN) IV, metoCLOPramide **OR** metoCLOPramide (REGLAN) injection, morphine injection, ondansetron **OR** ondansetron (ZOFRAN) IV   Objective: Vitals:   01/24/19 2211 01/25/19 0552  BP: 114/66 120/69  Pulse: 77 71  Resp: 18 20  Temp: 98.1 F (36.7 C) 98.2 F (36.8 C)  SpO2: 97% 97%    Intake/Output Summary (Last 24 hours) at 01/25/2019 1331 Last data filed at 01/25/2019 0600 Gross per 24 hour  Intake 959.76 ml  Output 850 ml  Net 109.76 ml   Filed Weights   01/21/19 1146 01/21/19 1700  Weight: 78 kg 73.9 kg   Weight change:  Body mass index is 28.86 kg/m.   Physical Exam:  Awake Alert, Oriented X 3, No new F.N deficits, Normal affect Symmetrical Chest wall movement, Good air movement bilaterally, CTAB RRR,No Gallops,Rubs or new Murmurs, No Parasternal Heave +ve B.Sounds, Abd Soft, No tenderness, No rebound - guarding or rigidity. No Cyanosis, Clubbing or edema, No new Rash or bruise    Data Review: I have personally reviewed the laboratory data and studies available.  Recent Labs  Lab 01/21/19 1246 01/22/19 0308 01/23/19 0404 01/24/19 0433 01/25/19 0144  WBC 8.9 6.3 9.1 7.6 7.6  NEUTROABS 7.1  --   --   --   --   HGB 12.0 11.2* 9.7* 9.1* 9.3*  HCT 35.6* 34.4* 30.0* 27.7* 28.7*  MCV 84.2 87.5 88.0 88.2 88.0  PLT 254 240 214 206 233   Recent Labs  Lab 01/21/19 1937 01/22/19 0308 01/23/19 0404 01/24/19 0433 01/25/19 0144  NA 138 139 135 134* 134*  K 3.3* 3.8 3.5 3.2* 3.5  CL 99 101 101 101 102  CO2 27 28 25 24 25   GLUCOSE 121* 156* 101* 100* 114*  BUN 20 19 16 15 15   CREATININE 0.70 0.69 0.48 0.52 0.48  CALCIUM 9.0 9.1 8.4* 8.0* 8.2*  MG 1.9  --   --   --   --     , MD  [facility]  01/25/2019

## 2019-01-25 NOTE — Plan of Care (Signed)
  Problem: Education: Goal: Knowledge of General Education information will improve Description: Including pain rating scale, medication(s)/side effects and non-pharmacologic comfort measures Outcome: Progressing   Problem: Pain Managment: Goal: General experience of comfort will improve Outcome: Progressing   Problem: Safety: Goal: Ability to remain free from injury will improve Outcome: Progressing   Problem: Nutrition: Goal: Adequate nutrition will be maintained Outcome: Progressing

## 2019-01-25 NOTE — Progress Notes (Signed)
Patients buttocks is red. Pt was encouraged all day to turn on her side with pillows between her legs.  She refused.  Barrier cream has been applied to her buttocks.

## 2019-01-25 NOTE — Progress Notes (Signed)
[place] for IV heparin  Indication: pulmonary embolus  No Known Allergies  Patient Measurements: Height: 5\' 3"  (160 cm) Weight: 162 lb 14.7 oz (73.9 kg) IBW/kg (Calculated) : 52.4 Heparin Dosing Weight: 68.1  Vital Signs: Temp: 98.9 F (37.2 C) (12/09 2025) BP: 126/71 (12/09 2025) Pulse Rate: 75 (12/09 2025)  Labs: Recent Labs    01/23/19 0404 01/24/19 0433 01/25/19 0144 01/25/19 1113 01/25/19 2016  HGB 9.7* 9.1* 9.3*  --   --   HCT 30.0* 27.7* 28.7*  --   --   PLT 214 206 233  --   --   HEPARINUNFRC  --   --  0.17* 0.44 0.23*  CREATININE 0.48 0.52 0.48  --   --     Estimated Creatinine Clearance: 59.4 mL/min (by C-G formula based on SCr of 0.48 mg/dL).  Medications:  Scheduled:  . acetaminophen  1,000 mg Oral Once  . docusate sodium  100 mg Oral BID  . polyethylene glycol  17 g Oral BID   Infusions:  . heparin 1,200 Units/hr (01/25/19 1714)  . methocarbamol (ROBAXIN) IV Stopped (01/22/19 1327)    Assessment: 73 yo female presented to hospital after fall now found to have bilateral PE to start IV heparin with plan to likely change to oral agent tomorrow per Md discussion. Baseline labs done. Not on any anticoag meds prior to admission.   Today, 01/25/2019:  Heparin level now SUBthereapeutic on heparin IV rate of 1200 units/hr after being therapeutic earlier today  No bleeding or infusion issues per RN  Goal of Therapy:  Heparin level 0.3-0.7 units/ml Monitor platelets by anticoagulation protocol: Yes   Plan:   Increase IV heparin from rate 1200 units/hr to 1500 units/hr  Recheck heparin level 8 hours after heparin rate increase  Daily CBC and heparin level  Plan transition to [place] 12/10; CM/SW notified   Adrian Saran, PharmD, BCPS 01/25/2019 9:09 PM

## 2019-01-25 NOTE — Progress Notes (Signed)
[place] for IV heparin  Indication: pulmonary embolus  No Known Allergies  Patient Measurements: Height: 5\' 3"  (160 cm) Weight: 162 lb 14.7 oz (73.9 kg) IBW/kg (Calculated) : 52.4 Heparin Dosing Weight: 68.1  Vital Signs: Temp: 98.2 F (36.8 C) (12/09 0552) BP: 120/69 (12/09 0552) Pulse Rate: 71 (12/09 0552)  Labs: Recent Labs    01/23/19 0404 01/24/19 0433 01/25/19 0144 01/25/19 1113  HGB 9.7* 9.1* 9.3*  --   HCT 30.0* 27.7* 28.7*  --   PLT 214 206 233  --   HEPARINUNFRC  --   --  0.17* 0.44  CREATININE 0.48 0.52 0.48  --     Estimated Creatinine Clearance: 59.4 mL/min (by C-G formula based on SCr of 0.48 mg/dL).  Medications:  Scheduled:  . acetaminophen  1,000 mg Oral Once  . docusate sodium  100 mg Oral BID  . polyethylene glycol  17 g Oral BID   Infusions:  . heparin 1,200 Units/hr (01/25/19 0515)  . methocarbamol (ROBAXIN) IV Stopped (01/22/19 1327)    Assessment: 73 yo female presented to hospital after fall now found to have bilateral PE to start IV heparin with plan to likely change to oral agent tomorrow per Md discussion. Baseline labs done. Not on any anticoag meds prior to admission.   Today, 01/25/2019:  Most recent heparin level therapeutic on 1200 units/hr  No bleeding or infusion issues per RN  Goal of Therapy:  Heparin level 0.3-0.7 units/ml Monitor platelets by anticoagulation protocol: Yes   Plan:   Continue IV heparin at 1200 units/hr   Check confirmatory heparin level in 8 hrs  Daily CBC and heparin level  Plan transition to [place] 12/10; CM/SW notified  Reuel Boom, PharmD, BCPS [phone] 01/25/2019, 12:11 PM

## 2019-01-26 LAB — CBC
HCT: 27.8 % — ABNORMAL LOW (ref 36.0–46.0)
Hemoglobin: 9 g/dL — ABNORMAL LOW (ref 12.0–15.0)
MCH: 28.6 pg (ref 26.0–34.0)
MCHC: 32.4 g/dL (ref 30.0–36.0)
MCV: 88.3 fL (ref 80.0–100.0)
Platelets: 243 10*3/uL (ref 150–400)
RBC: 3.15 MIL/uL — ABNORMAL LOW (ref 3.87–5.11)
RDW: 13.4 % (ref 11.5–15.5)
WBC: 7 10*3/uL (ref 4.0–10.5)
nRBC: 0 % (ref 0.0–0.2)

## 2019-01-26 LAB — BASIC METABOLIC PANEL
Anion gap: 9 (ref 5–15)
BUN: 13 mg/dL (ref 8–23)
CO2: 25 mmol/L (ref 22–32)
Calcium: 8.3 mg/dL — ABNORMAL LOW (ref 8.9–10.3)
Chloride: 102 mmol/L (ref 98–111)
Creatinine, Ser: 0.46 mg/dL (ref 0.44–1.00)
GFR calc Af Amer: >60 mL/min (ref >60)
GFR calc non Af Amer: >60 mL/min (ref >60)
Glucose, Bld: 104 mg/dL — ABNORMAL HIGH (ref 70–99)
Potassium: 3.3 mmol/L — ABNORMAL LOW (ref 3.5–5.1)
Sodium: 136 mmol/L (ref 135–145)

## 2019-01-26 LAB — HEPARIN LEVEL (UNFRACTIONATED): Heparin Unfractionated: 0.68 IU/mL (ref 0.30–0.70)

## 2019-01-26 MED ORDER — APIXABAN 5 MG PO TABS: 5.0000 mg | ORAL_TABLET | Freq: Two times a day (BID) | ORAL | Status: DC

## 2019-01-26 MED ORDER — APIXABAN 5 MG PO TABS
10.0000 mg | ORAL_TABLET | Freq: Two times a day (BID) | ORAL | Status: DC
  Administered 2019-01-26: 10 mg via ORAL
  Filled 2019-01-26: qty 2

## 2019-01-26 MED ORDER — ELIQUIS DVT/PE STARTER PACK 5 MG PO TBPK: ORAL_TABLET | ORAL | 0 refills | Status: AC

## 2019-01-26 MED ORDER — POTASSIUM CHLORIDE CRYS ER 20 MEQ PO TBCR
40.0000 meq | EXTENDED_RELEASE_TABLET | Freq: Four times a day (QID) | ORAL | Status: DC
  Administered 2019-01-26: 40 meq via ORAL
  Filled 2019-01-26: qty 2

## 2019-01-26 MED ORDER — TRAMADOL HCL 50 MG PO TABS
50.0000 mg | ORAL_TABLET | Freq: Four times a day (QID) | ORAL | Status: DC | PRN
  Administered 2019-01-26: 50 mg via ORAL
  Filled 2019-01-26: qty 1

## 2019-01-26 MED ORDER — OSCAL 500/200 D-3 500-200 MG-UNIT PO TABS: 1.0000 | ORAL_TABLET | Freq: Two times a day (BID) | ORAL | 3 refills | Status: AC

## 2019-01-26 MED ORDER — FERROUS SULFATE 325 (65 FE) MG PO TBEC: 325.0000 mg | DELAYED_RELEASE_TABLET | Freq: Two times a day (BID) | ORAL | 0 refills | Status: AC

## 2019-01-26 NOTE — Progress Notes (Signed)
ANTICOAGULATION CONSULT NOTE   Pharmacy Consult for IV heparin  Indication: pulmonary embolus  No Known Allergies  Patient Measurements: Height: 5\' 3"  (160 cm) Weight: 162 lb 14.7 oz (73.9 kg) IBW/kg (Calculated) : 52.4 Heparin Dosing Weight: 68.1  Vital Signs: Temp: 98.1 F (36.7 C) (12/10 0542) BP: 118/71 (12/10 0542) Pulse Rate: 70 (12/10 0542)  Labs: Recent Labs    01/24/19 0433 01/24/19 0433 01/25/19 0144 01/25/19 1113 01/25/19 2016 01/26/19 0330 01/26/19 0646  HGB 9.1*  --  9.3*  --   --  9.0*  --   HCT 27.7*  --  28.7*  --   --  27.8*  --   PLT 206  --  233  --   --  243  --   HEPARINUNFRC  --    < > 0.17* 0.44 0.23*  --  0.68  CREATININE 0.52  --  0.48  --   --  0.46  --    < > = values in this interval not displayed.   Estimated Creatinine Clearance: 59.4 mL/min (by C-G formula based on SCr of 0.46 mg/dL).  Medical History: Past Medical History:  Diagnosis Date  . Hypertension   . Osteoporosis   . Rheumatoid arthritis (HCC)    Medications:  Scheduled:  . acetaminophen  1,000 mg Oral Once  . docusate sodium  100 mg Oral BID  . polyethylene glycol  17 g Oral BID   Infusions:  . heparin 1,500 Units/hr (01/25/19 2132)  . methocarbamol (ROBAXIN) IV Stopped (01/22/19 1327)   Assessment: 73 yo female presented to hospital after fall now found to have bilateral PE to start IV heparin with plan to likely change to oral agent tomorrow per Md discussion. Baseline labs done. Not on any anticoag meds prior to admission.  - started Heparin 12/8 with 2000 unit bolus, 1000 units/hr  Goal of Therapy:  Heparin level 0.3-0.7 units/ml Monitor platelets by anticoagulation protocol: Yes   Today, 01/26/2019 0646 Hep level = 0.68 on 1500 units/hr Anticipate transition to oral anti-coag today   Plan:  Continue Heparin at 1500 units/hr No follow up level yet - await further anti-coag decision Daily CBC, daily heparin level Follow up plan for transition to oral  anticoagulation  Nyoka Cowden, Rona Ravens, PharmD 01/26/2019,7:13 AM

## 2019-01-26 NOTE — Discharge Summary (Signed)
Catherine Wong, is a 73 y.o. female  DOB Mar 22, 1945  MRN [id number].  Admission date:  01/21/2019  Admitting Physician  Toy Baker, MD  Discharge Date:  01/26/2019   Primary MD  Patient, No Pcp Per  Recommendations for primary care physician for things to follow:  -Please check CBC, CMP during next visit. -Patient need to follow-up with vascular surgery as an outpatient regarding incidental finding of atherosclerotic ulcer in the inferior aspect of transverse aortic arch. -Follow with orthopedic as an outpatient. -Patient need to continue full anticoagulation for 3 to 6 months for provoked PE.   Admission Diagnosis  Hypokalemia [E87.6] Pre-op exam [Z01.818] Closed subcapital fracture of left femur, initial encounter ([place]) [S72.012A]   Discharge Diagnosis  Hypokalemia [E87.6] Pre-op exam [Z01.818] Closed subcapital fracture of left femur, initial encounter ([place]) [S72.012A]    Active Problems:   Hip fracture (HCC)   Hip fracture, unspecified laterality, closed, initial encounter [facility])   Essential hypertension   Fall at home, initial encounter   Osteoporosis   Hypokalemia   Closed subcapital fracture of left femur [facility])      Past Medical History:  Diagnosis Date   Hypertension    Osteoporosis    Rheumatoid arthritis ([place])     Past Surgical History:  Procedure Laterality Date   BREAST SURGERY     HIP ARTHROPLASTY Left 01/22/2019   Procedure: ARTHROPLASTY BIPOLAR HIP (HEMIARTHROPLASTY);  Surgeon: Marybelle Killings, MD;  Location: [facility];  Service: Orthopedics;  Laterality: Left;   LAPAROSCOPY FOR ECTOPIC PREGNANCY         History of present illness and  Hospital Course:     Kindly see H&P for history of present illness and admission details, please review complete Labs, Consult reports and Test reports for all details in brief  HPI  from the history and physical done on  the day of admission 01/21/2019 HPI: Catherine Wong is a 73 y.o. female with medical history significant of poliomyelitis as child with left leg chronic weakness, hypertension osteoporosis rheumatoid arthritis, hx of breast cancer    Presented with  Fall 5 days ago while in [place] with  left side hip pain  No LOC, hx of polio as a child.  She has been walking with a brace ever since she was 73 years old. Reports a mechanical fall when she lost her balance.  Denies hitting her head.  Since that she has been having significant pain and been using wheelchair.  No other pain anywhere else.  She has been trying to use ibuprofen to help with her pain but it persisted so she presented to [place].  Patient recently moved from [place] with her family for the past 5 days They traveled by car  Denis any hx of CAD She denies shortness of breath or chest pain with walking  she quit smoking 2 years   Infectious risk factors:  Reports N/V  No fever or chills She has been isolating    Hospital Course  Catherine Wong is a 73 y.o. female with PMH of poliomyelitisas child with left leg chronic weakness,hypertension, osteoporosis, rheumatoid arthritis, hx of breast cancer. She presented to the ED on 12/5 after a fall 5 days ago leading to left hip pain. Patient has chronic left leg weakness secondary to poliomyelitis as a child.  She has been walking with a brace ever since she was 73 years old.   5 days ago, while preparing to move from [place] to here, patient had a mechanical fall and started hurting her left hip.  They travelled anyway.  She has been using a wheelchair since then.  Pain got worse despite using ibuprofen and has presented to the ED at [facility] on 12/5.  Her work-up significant for left hip fracture, she went for surgical repair by Dr. Ophelia Charter 12/6, she was noted to be hypoxic with oxygen requirement, CTA chest significant for PE.  Left hip  fracture -Mechanical fall leading to subcapital nondisplaced hip fracture. -12/6, underwent left hip bipolar hemiarthroplasty by Dr. Ophelia Charter . -PT eval appreciated. Home health PT recommended.  Acute respiratory with hypoxia/acute PE -Patient has been requiring low-flow oxygen postoperatively.CTA chest significant for PE, this is most likely present on admission,  provoked PE in the setting of her left hip fracture, more than 10 days ago, where she had a fall and fracture, more than 10 days ago, where she did not seek any medical attention, and traveling from  [place] long distance with her fracture,  - this is second episode of DVT, she had right lower extremity DVT 2007 status post right knee surgery. -CTA chest with evidence of right heart strain, so she was kept on heparin GTT for 48 hours, she is transition to [employer], Child psychotherapist assisted with providing Eliquis on discharge .  incidental finding of atherosclerotic ulcer in the inferior aspect of transverse aortic arch. -I have discussed with vascular surgery Dr. Chestine Spore, who reviewed the imaging, does appear to be chronic, and calcified, patient denies any chest pain, recommendation for outpatient follow-up with vascular surgery, commendation been giving to the patient to follow with vascular surgery as an outpatient.  Left lower extremity swelling -Patient recently traveled from [place] with a fractured hip. She has been less mobile since then. -Ultrasound duplex 12/6 was negative for DVT.  Acute blood loss anemia -Postoperative, expected, continue with iron supplement.  Hypokalemia -Repleted  Chronic left leg weakness  -Secondary to poliomyelitis as a child.   Essential hypertension -Home meds include lisinopril and HCTZ.Both are currently on hold given stable blood pressure during hospital stay, no indication to resume on discharge.  Hyperlipidemia -statin  Breast cancer - in left breast in remission 19 years  ago  Rheumatoid arthritis    Discharge Condition:  stable   Follow UP  Follow-up Information    Eldred Manges, MD. Schedule an appointment as soon as possible for a visit today.   Specialty: Orthopedic Surgery Why: needs return office visit with Dr Ophelia Charter 2 weeks postop Contact information: [address] [place] [address] [phone]        [facility]. Go to.   Why: Go directly to the Pharmacy for your medications at discharge.  Please go to your follow up appointment February 02, 2019 at 3:10pm. Please arrive 15 minutes early to complete new patient forms.  Contact information: [address] [place] [address] [phone]       Cephus Shelling,  MD Follow up in 2 week(s).   Specialty: Vascular Surgery Contact information: [address] [place] [address] [phone]             Discharge Instructions  and  Discharge Medications     Discharge Instructions    Diet - low sodium heart healthy   Complete by: As directed    Increase activity slowly   Complete by: As directed      Allergies as of 01/26/2019   No Known Allergies     Medication List    STOP taking these medications   hydrochlorothiazide 25 MG tablet Commonly known as: HYDRODIURIL   ibuprofen 200 MG tablet Commonly known as: ADVIL   lisinopril 20 MG tablet Commonly known as: ZESTRIL   traMADol 50 MG tablet Commonly known as: ULTRAM     TAKE these medications   Eliquis DVT/PE Starter Pack 5 MG Tbpk Generic drug: Apixaban Starter Pack Take as directed on package: start with two-5mg  tablets twice daily for 7 days. On day 8, switch to one-5mg  tablet twice daily.   ferrous sulfate 325 (65 FE) MG EC tablet Take 1 tablet (325 mg total) by mouth 2 (two) times daily.   HYDROcodone-acetaminophen 7.5-325 MG tablet Commonly known as: NORCO Take 1-2 tablets by mouth every 6 (six) hours as needed for severe pain  (pain score 7-10).   methocarbamol 500 MG tablet Commonly known as: ROBAXIN Take 1 tablet (500 mg total) by mouth every 6 (six) hours as needed for muscle spasms.   Oscal 500/200 D-3 500-200 MG-UNIT tablet Generic drug: calcium-vitamin D Take 1 tablet by mouth 2 (two) times daily.            Durable Medical Equipment  (From admission, onward)         Start     Ordered   01/24/19 1557  For home use only DME oxygen  Once    Question Answer Comment  Length of Need Lifetime   Mode or (Route) Nasal cannula   Liters per Minute 2   Frequency Continuous (stationary and portable oxygen unit needed)   Oxygen delivery system Gas      01/24/19 1556   01/24/19 1519  For home use only DME Walker rolling  Once    Question:  Patient needs a walker to treat with the following condition  Answer:  Orthodontics aftercare   01/24/19 1518            Diet and Activity recommendation: See Discharge Instructions above   Consults obtained -  Ortho   Major procedures and Radiology Reports - PLEASE review detailed and final reports for all details, in brief -     CT ANGIO CHEST PE W OR WO CONTRAST  Result Date: 01/24/2019 CLINICAL DATA:  Dyspnea on exertion. EXAM: CT ANGIOGRAPHY CHEST WITH CONTRAST TECHNIQUE: Multidetector CT imaging of the chest was performed using the standard protocol during bolus administration of intravenous contrast. Multiplanar CT image reconstructions and MIPs were obtained to evaluate the vascular anatomy. CONTRAST:  OMNIPAQUE IOHEXOL 350 MG/ML SOLN COMPARISON:  None. FINDINGS: Cardiovascular: Linear filling defect is seen extending from upper lobe branch of right pulmonary artery into lower lobe branch. Another smaller embolus is noted in lower lobe branch of left pulmonary artery. RV/LV ratio of 1.2 is noted suggesting possible right heart strain. Normal cardiac size. No pericardial effusion. Coronary artery calcifications are noted. Atherosclerosis of  thoracic aorta is noted. Penetrating atherosclerotic ulcer is seen along inferior aspect of transverse  aortic arch measuring 23 x 8 mm. Mediastinum/Nodes: No enlarged mediastinal, hilar, or axillary lymph nodes. Thyroid gland, trachea, and esophagus demonstrate no significant findings. Lungs/Pleura: No pneumothorax or pleural effusion is noted. Minimal right posterior basilar subsegmental atelectasis is noted. Upper Abdomen: No acute abnormality. Musculoskeletal: No chest wall abnormality. No acute or significant osseous findings. Review of the MIP images confirms the above findings. IMPRESSION: Bilateral pulmonary emboli are noted. Positive for acute PE with CT evidence of right heart strain (RV/LV Ratio = 1.2) consistent with at least submassive (intermediate risk) PE. The presence of right heart strain has been associated with an increased risk of morbidity and mortality. Please activate Code PE by paging [phone]. Critical Value/emergent results were called by telephone at the time of interpretation on 01/24/2019 at 5:40 pm to providerBINAYA [facility] , who verbally acknowledged these results. 2.3 x 0.8 cm penetrating atherosclerotic ulcer is seen along inferior aspect of transverse aortic arch. Coronary artery calcifications are noted. Aortic Atherosclerosis (ICD10-I70.0). Electronically Signed   By: Lupita Raider M.D.   On: 01/24/2019 17:40   Pelvis Portable  Result Date: 01/22/2019 CLINICAL DATA:  Status post left hip arthroplasty. EXAM: PORTABLE PELVIS 1-2 VIEWS COMPARISON:  01/21/2019 FINDINGS: There has been interval left hip hemiarthroplasty. Hardware components are in anatomic alignment. No periprosthetic fracture or subluxation identified. Previous ORIF of the right femur with IM nail placement. IMPRESSION: 1. No complications status post left hip hemiarthroplasty. Electronically Signed   By: Signa Kell M.D.   On: 01/22/2019 15:50   DG Chest Port 1 View  Result Date: 01/21/2019 CLINICAL  DATA:  Preop exam EXAM: PORTABLE CHEST 1 VIEW COMPARISON:  None FINDINGS: The heart size and mediastinal contours are within normal limits. Both lungs are clear. The visualized skeletal structures are unremarkable. IMPRESSION: No acute cardiopulmonary disease. Electronically Signed   By: Donzetta Kohut M.D.   On: 01/21/2019 12:42   DG Hip Unilat W or Wo Pelvis 2-3 Views Left  Result Date: 01/21/2019 CLINICAL DATA:  Left hip pain, status post fall EXAM: DG HIP (WITH OR WITHOUT PELVIS) 2-3V LEFT COMPARISON:  None. FINDINGS: Nondisplaced subcapital left hip fracture. Status post ORIF of the right hip. Visualized bony pelvis appears intact. Degenerative changes of the lower lumbar spine. IMPRESSION: Nondisplaced subcapital left hip fracture. Electronically Signed   By: Charline Bills M.D.   On: 01/21/2019 12:18   VAS [place] LOWER EXTREMITY VENOUS (DVT)  Result Date: 01/22/2019  Lower Venous Study Indications: Edema.  Risk Factors: Trauma. Limitations: Immobility. Comparison Study: No prior studies. Performing Technologist: Chanda Busing RVT  Examination Guidelines: A complete evaluation includes B-mode imaging, spectral Doppler, color Doppler, and power Doppler as needed of all accessible portions of each vessel. Bilateral testing is considered an integral part of a complete examination. Limited examinations for reoccurring indications may be performed as noted.  +-----+---------------+---------+-----------+----------+--------------+  RIGHT Compressibility Phasicity Spontaneity Properties Thrombus Aging  +-----+---------------+---------+-----------+----------+--------------+  CFV   Full            Yes       Yes                                    +-----+---------------+---------+-----------+----------+--------------+   +---------+---------------+---------+-----------+----------+--------------+  LEFT      Compressibility Phasicity Spontaneity Properties Thrombus Aging   +---------+---------------+---------+-----------+----------+--------------+  CFV       Full            Yes  Yes                                    +---------+---------------+---------+-----------+----------+--------------+  SFJ       Full                                                             +---------+---------------+---------+-----------+----------+--------------+  FV Prox   Full                                                             +---------+---------------+---------+-----------+----------+--------------+  FV Mid    Full                                                             +---------+---------------+---------+-----------+----------+--------------+  FV Distal Full                                                             +---------+---------------+---------+-----------+----------+--------------+  PFV       Full                                                             +---------+---------------+---------+-----------+----------+--------------+  POP       Full            Yes       Yes                                    +---------+---------------+---------+-----------+----------+--------------+  PTV       Full                                                             +---------+---------------+---------+-----------+----------+--------------+  PERO      Full                                                             +---------+---------------+---------+-----------+----------+--------------+     Summary: Right: No evidence of common femoral vein obstruction. Left: There is no evidence of deep  vein thrombosis in the lower extremity. No cystic structure found in the popliteal fossa.  *See table(s) above for measurements and observations. Electronically signed by Waverly Ferrari MD on 01/22/2019 at 3:54:05 PM.    Final     Micro Results     Recent Results (from the past 240 hour(s))  SARS CORONAVIRUS 2 (TAT 6-24 HRS) Nasopharyngeal Nasopharyngeal Swab     Status: None    Collection Time: 01/21/19 12:46 PM   Specimen: Nasopharyngeal Swab  Result Value Ref Range Status   SARS Coronavirus 2 NEGATIVE NEGATIVE Final    Comment: (NOTE) SARS-CoV-2 target nucleic acids are NOT DETECTED. The SARS-CoV-2 RNA is generally detectable in upper and lower respiratory specimens during the acute phase of infection. Negative results do not preclude SARS-CoV-2 infection, do not rule out co-infections with other pathogens, and should not be used as the sole basis for treatment or other patient management decisions. Negative results must be combined with clinical observations, patient history, and epidemiological information. The expected result is Negative. Fact Sheet for Patients: HairSlick.no Fact Sheet for Healthcare Providers: quierodirigir.com This test is not yet approved or cleared by the [place] FDA and  has been authorized for detection and/or diagnosis of SARS-CoV-2 by FDA under an Emergency Use Authorization (EUA). This EUA will remain  in effect (meaning this test can be used) for the duration of the COVID-19 declaration under Section 56 4(b)(1) of the Act, 21 U.S.C. section 360bbb-3(b)(1), unless the authorization is terminated or revoked sooner. Performed at [facility], [address] [place], [place] [address]   MRSA PCR Screening     Status: None   Collection Time: 01/21/19  5:47 PM   Specimen: Nasal Mucosa; Nasopharyngeal  Result Value Ref Range Status   MRSA by PCR NEGATIVE NEGATIVE Final    Comment:        The GeneXpert MRSA Assay (FDA approved for NASAL specimens only), is one component of a comprehensive MRSA colonization surveillance program. It is not intended to diagnose MRSA infection nor to guide or monitor treatment for MRSA infections. Performed at [facility], [address] [place], [place] [address]        Today   Subjective:    Catherine Wong today has no headache,no chest or abdominal pain,no new weakness tingling or numbness, feels much better wants to go home today.   Objective:   Blood pressure 118/71, pulse 70, temperature 98.1 F (36.7 C), resp. rate 16, height 5\' 3"  (1.6 m), weight 73.9 kg, SpO2 99 %.   Intake/Output Summary (Last 24 hours) at 01/26/2019 1244 Last data filed at 01/26/2019 0835 Gross per 24 hour  Intake 1421.91 ml  Output 1200 ml  Net 221.91 ml    Exam Awake Alert, Oriented x 3, No new F.N deficits, Normal affect Symmetrical Chest wall movement, Good air movement bilaterally, CTAB RRR,No Gallops,Rubs or new Murmurs, No Parasternal Heave +ve B.Sounds, Abd Soft, Non tender, No rebound -guarding or rigidity. No Cyanosis, Clubbing or edema, No new Rash or bruise  Data Review   CBC w Diff:  Lab Results  Component Value Date   WBC 7.0 01/26/2019   HGB 9.0 (L) 01/26/2019   HCT 27.8 (L) 01/26/2019   PLT 243 01/26/2019   LYMPHOPCT 9 01/21/2019   MONOPCT 9 01/21/2019   EOSPCT 1 01/21/2019   BASOPCT 1 01/21/2019    CMP:  Lab Results  Component Value Date   NA 136 01/26/2019   K 3.3 (L)  01/26/2019   CL 102 01/26/2019   CO2 25 01/26/2019   BUN 13 01/26/2019   CREATININE 0.46 01/26/2019   ALBUMIN 3.5 01/22/2019  .   Total Time in preparing paper work, data evaluation and todays exam - 35 minutes  Huey Bienen[place]awood Taraji Mungo M.D on 01/26/2019 at 12:44 PM  [facility]   Office  [phone]

## 2019-01-26 NOTE — TOC Transition Note (Signed)
Transition of Care [facility]) - CM/SW Discharge Note   Patient Details  Name: Catherine Wong MRN: [id number] Date of Birth: August 16, 1945  [facility] CM/SW Contact:  Lia Hopping, [place] Phone Number:  01/26/2019, 1:33 PM   Clinical Narrative:    CSW followed up with the patient at bedside after  Eliquis education session w/pharmacy. Patient enroll in 30 day free coupon program for Eliquis. Patient notified CSW she plans to call her insurance Monday to enroll in prescription coverage.Patient reports she had prescription coverage prior to moving from [place].  Patient reports understanding the importance of the medication and agreeable to pay out pocket expenses until she has coverage. Patient CSW notified the patient about her scheduled appointment at the [facility] December 17, at 3:10pm. CSW documented information on AVS. Patient Friend at bedside agreed to transport her. CSW informed [place] Rep. Butch Penny about the patient discharge and provided another contact number. DME-RW arranged.   Final next level of care: Home w Home Health Services Barriers to Discharge: Barriers Resolved   Patient Goals and CMS Choice   CMS Medicare.gov Compare Post Acute Care list provided to:: Patient Choice offered to / list presented to : Patient  Discharge Placement                       Discharge Plan and Services In-house Referral: Clinical Social Work Discharge Planning Services: CM Consult Post Acute Care Choice: Home Health          DME Arranged: Gilford Rile rolling DME Agency: [employer] Date DME Agency Contacted: 01/24/19 Time DME Agency Contacted: [phone] Representative spoke with at DME Agency: [place]: PT, OT [place] Agency: [place] ([place]) Date [place]: 01/24/19 Time [employer]: 1527 Representative spoke with at [place]: [place] ([place]) Interventions     Readmission  Risk Interventions No flowsheet data found.

## 2019-01-26 NOTE — Progress Notes (Signed)
Physical Therapy Treatment Patient Details Name: Catherine Wong MRN: [id number] DOB: 07/10/1945 Today's Date: 01/26/2019    History of Present Illness Pt is 73 yo female who fell 6 days prior to admission.  She has PMH of HTN, osteoporosis, RA, hx of breast CA, multiple R knee surgeries and locked in extension, and polio and wears brace on L LE.  Pt admitted with subcapital nondisplaced L hip fx.  She is s/p L hip bipolar hemiarthroplasty posterior approach on 01/22/19.  Pt had CT on 01/24/19 that revealed bil PE and she was started on heparin drip.  Was on heparin drip at least 24 hours, heparin labs .68 and pt cleared for activity and stable per MD.    PT Comments    Pt demonstrating improved transfers with min guard for safety.  She does continue to need cues for hip precautions - particularly when scooting toward EOB (discussed with her friend who will be assisting).  O2 sats were stable on RA with activity (pt does not want to attempt ambulation until able to wear KAFO despite knee immobilizer and use of crutches)- maintained 91%.     Follow Up Recommendations  Home health PT     Equipment Recommendations  Rolling walker with 5" wheels    Recommendations for Other Services       Precautions / Restrictions Precautions Precautions: Posterior Hip;Fall Precaution Booklet Issued: Yes (comment) Required Braces or Orthoses: Knee Immobilizer - Left Knee Immobilizer - Left: On when out of bed or walking(has KAFO that does not fit due to edema) Restrictions LLE Weight Bearing: Non weight bearing    Mobility  Bed Mobility Overal bed mobility: Needs Assistance Bed Mobility: Supine to Sit     Supine to sit: Min guard     General bed mobility comments: used UE to assist L LE;  multiple cues for hip precautions (limited due to [facility])- tends to attempt to lean past 90 with scooting to EOB - discussed with caregiver/friend to monitor this  Transfers Overall transfer level: Needs  assistance Equipment used: Rolling walker (2 wheeled) Transfers: Sit to/from [employer] Sit to Stand: Min guard Stand pivot transfers: Min guard       General transfer comment: Pt cued for hip precautions with all transfers.  She pivoted to the right side to bsc and then to recliner using her crutches.  Performed sit to stand x 7 with heavy use of UE and min guard. Discussed safe technique for car transfers  Ambulation/Gait Ambulation/Gait assistance: Min assist Gait Distance (Feet): 1 Feet Assistive device: Crutches       General Gait Details: Pt took 2 very small shuffle steps during pivot to chair with knee immobilizer and crutches; declined further ambulation due to fear of putting all weight on R LE.   Stairs             Wheelchair Mobility    Modified Rankin (Stroke Patients Only)       Balance Overall balance assessment: Needs assistance Sitting-balance support: No upper extremity supported;Feet supported Sitting balance-Leahy Scale: Good       Standing balance-Leahy Scale: Fair                              Cognition Arousal/Alertness: Awake/alert Behavior During Therapy: WFL for tasks assessed/performed Overall Cognitive Status: Within Functional Limits for tasks assessed  General Comments: limited by [facility]      Exercises      General Comments General comments (skin integrity, edema, etc.): Nurse reports needing ambulating O2 sats.  Pt does not ambulate at this time, but did get sats with transfers and with 5 x sit to stand.  On RA at rest sats 96%, with activity sats 91% on RA.      Pertinent Vitals/Pain Pain Assessment: No/denies pain    Home Living                      Prior Function            PT Goals (current goals can now be found in the care plan section) Progress towards PT goals: Progressing toward goals    Frequency    Min 5X/week       PT Plan Current plan remains appropriate    Co-evaluation              AM-PAC PT "6 Clicks" Mobility   Outcome Measure  Help needed turning from your back to your side while in a flat bed without using bedrails?: None Help needed moving from lying on your back to sitting on the side of a flat bed without using bedrails?: None Help needed moving to and from a bed to a chair (including a wheelchair)?: None Help needed standing up from a chair using your arms (e.g., wheelchair or bedside chair)?: None Help needed to walk in hospital room?: A Lot Help needed climbing 3-5 steps with a railing? : Total 6 Click Score: 19    End of Session Equipment Utilized During Treatment: Gait belt;Left knee immobilizer Activity Tolerance: Patient tolerated treatment well Patient left: in chair;with chair alarm set;with call bell/phone within reach;with family/visitor present Nurse Communication: Mobility status PT Visit Diagnosis: Other abnormalities of gait and mobility (R26.89);Muscle weakness (generalized) (M62.81)     Time: 0867-6195 PT Time Calculation (min) (ACUTE ONLY): 25 min  Charges:  $Therapeutic Activity: 23-37 mins                     Catherine Wong, PT Acute Rehab Services Pager [phone] [facility] [phone] [facility] [phone]    Catherine Wong 01/26/2019, 2:18 PM

## 2019-01-26 NOTE — Progress Notes (Signed)
ANTICOAGULATION CONSULT NOTE - Follow Up Consult  Pharmacy Consult for heparin --> Eliquis Indication: acute bilateral pulmonary embolus  No Known Allergies  Patient Measurements: Height: 5\' 3"  (160 cm) Weight: 162 lb 14.7 oz (73.9 kg) IBW/kg (Calculated) : 52.4 Heparin Dosing Weight:   Vital Signs: Temp: 98.1 F (36.7 C) (12/10 0542) BP: 118/71 (12/10 0542) Pulse Rate: 70 (12/10 0542)  Labs: Recent Labs    01/24/19 0433 01/24/19 0433 01/25/19 0144 01/25/19 1113 01/25/19 2016 01/26/19 0330 01/26/19 0646  HGB 9.1*  --  9.3*  --   --  9.0*  --   HCT 27.7*  --  28.7*  --   --  27.8*  --   PLT 206  --  233  --   --  243  --   HEPARINUNFRC  --    < > 0.17* 0.44 0.23*  --  0.68  CREATININE 0.52  --  0.48  --   --  0.46  --    < > = values in this interval not displayed.    Estimated Creatinine Clearance: 59.4 mL/min (by C-G formula based on SCr of 0.46 mg/dL).  Assessment: Patient is a 73 y.o F with hx DVT presented to the ED on 12/5 with c/o left hip pain s/p fall 5 days prior to admission.  X-Ray showed a closed left-sided subcapital nondisplaced hip fracture.  She underwent left hip bipolar hemiarthroplasty on 12/6 and was started on aspirin 325 mg daily post-op for VTE prophylaxis.  Chest CTA on 12/8 was positive for bilateral PE with evidence of right heart strain, consistent with at least submassive. Heparin drip started 12/8 for acute PE.  Pharmacy was consulted on 12/10 to transition patient to Eliquis.   Plan:  - d/c heparin drip - start Eliquis 10 mg bid for 7 days, then 5 mg bid - monitor for s/s bleeding  Maurica Omura P 01/26/2019,7:56 AM

## 2019-01-27 ENCOUNTER — Telehealth: Payer: Self-pay | Admitting: Orthopaedic Surgery

## 2019-01-27 MED FILL — !ELIQUIS 5MG TABLET: 5 | 30 days supply | Qty: 74 | Fill #0

## 2019-01-27 NOTE — Telephone Encounter (Signed)
Catherine Wong with [facility] called in requesting verbal orders for home health pt for 1 time a week for 1 week, 3 times a week for 1 week, and 1 time a week for 3 weeks. Please give her a call   [phone]

## 2019-01-27 NOTE — Telephone Encounter (Signed)
I called Catherine Wong and advised.

## 2019-01-27 NOTE — Telephone Encounter (Signed)
OK - thanks

## 2019-01-27 NOTE — Telephone Encounter (Signed)
Please advise 

## 2019-02-01 NOTE — Progress Notes (Signed)
Patient ID: Catherine Wong, female   DOB: [birth year], 73 y.o.   MRN: [id number] Virtual Visit via Telephone Note  I connected with Catherine Wong on 02/02/19 at  3:10 PM EST by telephone and verified that I am speaking with the correct person using two identifiers.   I discussed the limitations, risks, security and privacy concerns of performing an evaluation and management service by telephone and the availability of in person appointments. I also discussed with the patient that there may be a patient responsible charge related to this service. The patient expressed understanding and agreed to proceed.  Patient location:  home My Location:  [facility] office Persons on the call: me and the patient.    History of Present Illness: hospitalized 12/5-12/11/2018.  She is doing well after having surgery.  She has f/up with ortho on 12/29.  Currently using a wheel chair. No fevers.  Pain is controlled.  Will do follow-up blood work day of ortho appt to minimize exposure.  No vascular appt has been made yet.  She recently moved here from [facility].  She was previously on BP meds but taken off in the hospital.  She does have a BP cuff at home  Recommendations for primary care physician for things to follow:  -Please check CBC, CMP during next visit. -Patient need to follow-up with vascular surgery as an outpatient regarding incidental finding of atherosclerotic ulcer in the inferior aspect of transverse aortic arch. -Follow with orthopedic as an outpatient. -Patient need to continue full anticoagulation for 3 to 6 months for provoked PE.   Admission Diagnosis  Hypokalemia [E87.6] Pre-op exam [Z01.818] Closed subcapital fracture of left femur, initial encounter (HCC) [S72.012A]   Discharge Diagnosis  Hypokalemia [E87.6] Pre-op exam [Z01.818] Closed subcapital fracture of left femur, initial encounter (HCC) [S72.012A]    Active Problems:   Hip fracture (HCC)   Hip fracture, unspecified laterality,  closed, initial encounter [facility])   Essential hypertension   Fall at home, initial encounter   Osteoporosis   Hypokalemia   Closed subcapital fracture of left femur (HCC)     History of present illness and  Hospital Course:     Kindly see H&P for history of present illness and admission details, please review complete Labs, Consult reports and Test reports for all details in brief  HPI  from the history and physical done on the day of admission 01/21/2019 HPI: Catherine Wong a 73 y.o.femalewith medical history significant of poliomyelitisas child with left leg chronic weakness,hypertension osteoporosis rheumatoid arthritis, hx of breast cancer   Presented withFall 5 days ago while in [place] with left side hip pain  No LOC, hx of polio as a child.She has been walking with a brace ever since she was 73 years old. Reports a mechanical fall when she lost her balance. Denies hitting her head. Since that she has been having significant pain and been using wheelchair. No other pain anywhere else. She has been trying to use ibuprofen to help with her pain but it persisted so she presented to med [facility].  Patient recently moved from [place] with her family for the past 5 days They traveled by car  Denis any hx of CAD She denies shortness of breath or chest pain with walking she quit smoking 2 years   Catherine Wong a 73 y.o.femalewith PMH of poliomyelitisas child with left leg chronic weakness,hypertension, osteoporosis, rheumatoid arthritis, hx of breast cancer. She presented to the ED on 12/5 after a fall 5  days ago leading to left hip pain. Patient has chronic left leg weakness secondary to poliomyelitis as a child. She has been walking with a brace ever since she was 73 years old.  5 days ago, while preparing to move from [place] to here, patient had a mechanical fall and started hurting her left hip. They travelled anyway. She has been  using a wheelchair since then. Pain got worse despite using ibuprofen and has presented to the ED at [facility] on 12/5.Her work-up significant for left hip fracture, she went for surgical repair by Dr. Ophelia Charter 12/6, she was noted to be hypoxic with oxygen requirement, CTA chest significant for PE.  Left hip fracture -Mechanical fall leading to subcapital nondisplaced hip fracture. -12/6, underwent left hip bipolar hemiarthroplasty by Dr. Ophelia Charter . -PT eval appreciated. Home health PT recommended.  Acute respiratory with hypoxia/acute PE -Patient has been requiring low-flow oxygen postoperatively.CTA chest significant for PE, this is most likely present on admission,provoked PE in the setting of her left hip fracture, more than 10 days ago, where she had a fall and fracture, more than 10 days ago, where she did not seek any medical attention, and traveling from  [place] long distance with her fracture,  -this is second episode of DVT, she had right lower extremity DVT 2007 status post right knee surgery. -CTA chest with evidence of right heart strain, so she was kept on heparin GTT for 48 hours, she is transition to [employer], Child psychotherapist assisted with providing Eliquis on discharge .  incidental finding of atherosclerotic ulcer in the inferior aspect of transverse aortic arch. -I have discussed with vascular surgery Dr. Chestine Spore, who reviewed the imaging, does appear to be chronic, and calcified, patient denies any chest pain, recommendation for outpatient follow-up with vascular surgery, commendation been giving to the patient to follow with vascular surgery as an outpatient.  Left lower extremity swelling -Patient recently traveled from [place] with a fractured hip. She has been less mobile since then. -Ultrasound duplex 12/6 was negative for DVT.  Acute blood loss anemia -Postoperative, expected, continue with iron supplement.  Hypokalemia -Repleted  Chronic left leg  weakness  -Secondary to poliomyelitis as a child.   Essential hypertension -Home meds include lisinopril and HCTZ.Both are currently on hold given stable blood pressure during hospital stay, no indication to resume on discharge.  Hyperlipidemia -statin  Breast cancer - in left breast in remission 19 years ago  Rheumatoid arthritis   Observations/Objective:  NAD.  A&Ox3   Assessment and Plan: 1. Closed fracture of left hip, initial encounter (HCC) - CBC with Differential; Future  2. Age-related osteoporosis with current pathological fracture, initial encounter  3. Essential hypertension Check BP OOO 3 to 5 times weekly and record and bring to next visit to assess whether she needs to be back on meds.  She was taken off Rx in the hospital.   - Comprehensive metabolic panel; Future  4. Hypokalemia - Comprehensive metabolic panel; Future 12/29  5. Atherosclerotic ulcer of aorta (HCC) Incidental finding.   - Ambulatory referral to Vascular Surgery   Follow Up Instructions: Assign PCP in 4-6 weeks   I discussed the assessment and treatment plan with the patient. The patient was provided an opportunity to ask questions and all were answered. The patient agreed with the plan and demonstrated an understanding of the instructions.   The patient was advised to call back or seek an in-person evaluation if the symptoms worsen or  if the condition fails to improve as anticipated.  I provided 12 minutes of non-face-to-face time during this encounter.   Freeman Caldron, PA-C

## 2019-02-02 ENCOUNTER — Other Ambulatory Visit: Payer: Self-pay

## 2019-02-02 ENCOUNTER — Telehealth: Payer: Self-pay | Admitting: Orthopaedic Surgery

## 2019-02-02 ENCOUNTER — Ambulatory Visit: Payer: Medicare Other | Attending: Family Medicine | Admitting: Physician Assistant

## 2019-02-02 DIAGNOSIS — E876 Hypokalemia: Secondary | ICD-10-CM

## 2019-02-02 DIAGNOSIS — I700 Atherosclerosis of aorta: Secondary | ICD-10-CM

## 2019-02-02 DIAGNOSIS — S72002A Fracture of unspecified part of neck of left femur, initial encounter for closed fracture: Secondary | ICD-10-CM

## 2019-02-02 DIAGNOSIS — M8000XA Age-related osteoporosis with current pathological fracture, unspecified site, initial encounter for fracture: Secondary | ICD-10-CM

## 2019-02-02 DIAGNOSIS — I10 Essential (primary) hypertension: Secondary | ICD-10-CM | POA: Diagnosis not present

## 2019-02-02 DIAGNOSIS — I719 Aortic aneurysm of unspecified site, without rupture: Secondary | ICD-10-CM

## 2019-02-02 NOTE — Telephone Encounter (Signed)
FYi,  I called . Best to use crutches and walk and not use W/C .

## 2019-02-02 NOTE — Telephone Encounter (Signed)
noted 

## 2019-02-02 NOTE — Telephone Encounter (Signed)
Please advise.  OK for rx for wheelchair?

## 2019-02-02 NOTE — Telephone Encounter (Signed)
Patient friend called for referral for wheelchair. They have the Rx but [facility] is requesting the referral to be called in [phone]

## 2019-02-02 NOTE — Progress Notes (Signed)
Patient has been called and DOB has been verified. Patient has been screened and transferred to PCP to start phone visit.     

## 2019-02-14 ENCOUNTER — Ambulatory Visit (INDEPENDENT_AMBULATORY_CARE_PROVIDER_SITE_OTHER): Payer: Medicare Other | Admitting: Orthopaedic Surgery

## 2019-02-14 ENCOUNTER — Ambulatory Visit: Payer: Medicare Other | Attending: Family Medicine

## 2019-02-14 ENCOUNTER — Ambulatory Visit: Payer: Self-pay

## 2019-02-14 ENCOUNTER — Encounter: Payer: Self-pay | Admitting: Orthopaedic Surgery

## 2019-02-14 ENCOUNTER — Other Ambulatory Visit: Payer: Self-pay

## 2019-02-14 VITALS — BP 139/92 | HR 100 | Ht 63.0 in | Wt 162.0 lb

## 2019-02-14 DIAGNOSIS — I10 Essential (primary) hypertension: Secondary | ICD-10-CM

## 2019-02-14 DIAGNOSIS — S72002A Fracture of unspecified part of neck of left femur, initial encounter for closed fracture: Secondary | ICD-10-CM

## 2019-02-14 DIAGNOSIS — M25552 Pain in left hip: Secondary | ICD-10-CM

## 2019-02-14 DIAGNOSIS — E876 Hypokalemia: Secondary | ICD-10-CM

## 2019-02-14 DIAGNOSIS — B91 Sequelae of poliomyelitis: Secondary | ICD-10-CM | POA: Insufficient documentation

## 2019-02-14 NOTE — Progress Notes (Addendum)
   Post-Op Visit Note   Patient: Catherine Wong           Date of Birth: Jan 01, 1946           MRN: [id number] Visit Date: 02/14/2019 PCP: Patient, No Pcp Per   Assessment & Plan: Return 1 month for recheck.  Staples removed today incision looks good.  Chief Complaint:  Chief Complaint  Patient presents with  . Left Hip - Routine Post Op    01/22/2019 Left Hip Hemiarthroplasty   Visit Diagnoses:  1. Pain in left hip    2. Hx polio with left long leg brace 3. Hx knee revision with infection and knee fusion with long IM nail Plan: Post left hip hemiarthroplasty for fracture.  She has polio with weakness of the left leg and wears a double upright brace with hip extension.  Opposite right knee had knee arthroplasty x2 done elsewhere with infection and ultimately had knee removed and long rod applied across the knee with fusion.  Right leg shorter and even though the right leg diffuse this was her better leg since it had more strength.  She feels like it short.  X-rays demonstrate good position of the hemiarthroplasty and on return in a month we can look at possible heel lift for right foot and consider some shoe buildup on the right.  No x-ray needed on return visit of her hips.  Follow-Up Instructions: Return in about 1 month (around 03/17/2019).   Orders:  Orders Placed This Encounter  Procedures  . XR HIP UNILAT W OR W/O PELVIS 2-3 VIEWS LEFT   No orders of the defined types were placed in this encounter.   Imaging: No results found.  PMFS History: Patient Active Problem List   Diagnosis Date Noted  . Closed subcapital fracture of left femur ([place])   . Hip fracture ([place]) 01/21/2019  . Hip fracture, unspecified laterality, closed, initial encounter ([place]) 01/21/2019  . Essential hypertension 01/21/2019  . Fall at home, initial encounter 01/21/2019  . Osteoporosis 01/21/2019  . Hypokalemia 01/21/2019   Past Medical History:  Diagnosis Date  . Hypertension   . Osteoporosis    . Rheumatoid arthritis ([place])     Family History  Problem Relation Age of Onset  . Breast cancer Mother   . CAD Mother   . Leukemia Other     Past Surgical History:  Procedure Laterality Date  . BREAST SURGERY    . HIP ARTHROPLASTY Left 01/22/2019   Procedure: ARTHROPLASTY BIPOLAR HIP (HEMIARTHROPLASTY);  Surgeon: Marybelle Killings, MD;  Location: [facility];  Service: Orthopedics;  Laterality: Left;  . LAPAROSCOPY FOR ECTOPIC PREGNANCY     Social History   Occupational History  . Not on file  Tobacco Use  . Smoking status: Current Every Day Smoker  . Smokeless tobacco: Never Used  Substance and Sexual Activity  . Alcohol use: Never  . Drug use: Never  . Sexual activity: Not on file

## 2019-02-15 LAB — COMPREHENSIVE METABOLIC PANEL
ALT: 7 IU/L (ref 0–32)
AST: 11 IU/L (ref 0–40)
Albumin/Globulin Ratio: 1.1 — ABNORMAL LOW (ref 1.2–2.2)
Albumin: 3.9 g/dL (ref 3.7–4.7)
Alkaline Phosphatase: 101 IU/L (ref 39–117)
BUN/Creatinine Ratio: 24 (ref 12–28)
BUN: 15 mg/dL (ref 8–27)
Bilirubin Total: 0.4 mg/dL (ref 0.0–1.2)
CO2: 24 mmol/L (ref 20–29)
Calcium: 9.6 mg/dL (ref 8.7–10.3)
Chloride: 101 mmol/L (ref 96–106)
Creatinine, Ser: 0.63 mg/dL (ref 0.57–1.00)
GFR calc Af Amer: 102 mL/min/{1.73_m2} (ref >59)
GFR calc non Af Amer: 89 mL/min/{1.73_m2} (ref >59)
Globulin, Total: 3.6 g/dL (ref 1.5–4.5)
Glucose: 89 mg/dL (ref 65–99)
Potassium: 4.4 mmol/L (ref 3.5–5.2)
Sodium: 139 mmol/L (ref 134–144)
Total Protein: 7.5 g/dL (ref 6.0–8.5)

## 2019-02-15 LAB — CBC WITH DIFFERENTIAL/PLATELET
Basophils Absolute: 0.1 10*3/uL (ref 0.0–0.2)
Basos: 1 %
EOS (ABSOLUTE): 0 10*3/uL (ref 0.0–0.4)
Eos: 1 %
Hematocrit: 31.5 % — ABNORMAL LOW (ref 34.0–46.6)
Hemoglobin: 10.5 g/dL — ABNORMAL LOW (ref 11.1–15.9)
Immature Grans (Abs): 0 10*3/uL (ref 0.0–0.1)
Immature Granulocytes: 1 %
Lymphocytes Absolute: 1.3 10*3/uL (ref 0.7–3.1)
Lymphs: 17 %
MCH: 27.9 pg (ref 26.6–33.0)
MCHC: 33.3 g/dL (ref 31.5–35.7)
MCV: 84 fL (ref 79–97)
Monocytes Absolute: 0.7 10*3/uL (ref 0.1–0.9)
Monocytes: 10 %
Neutrophils Absolute: 5.3 10*3/uL (ref 1.4–7.0)
Neutrophils: 70 %
Platelets: 443 10*3/uL (ref 150–450)
RBC: 3.76 x10E6/uL — ABNORMAL LOW (ref 3.77–5.28)
RDW: 12.8 % (ref 11.7–15.4)
WBC: 7.4 10*3/uL (ref 3.4–10.8)

## 2019-02-16 NOTE — Progress Notes (Signed)
Patient ID: Catherine Wong, female   DOB: [birth year], 73 y.o.   MRN: [id number]  due to hip fracture, patient has mobility limitations which cannot be resolved with a cane, crutch, or walker. Patient can safely self propel the 20x18 manual wheelchair or has a caregiver who can assist at all times.   Needs 20X18 WC

## 2019-03-17 ENCOUNTER — Other Ambulatory Visit: Payer: Self-pay

## 2019-03-17 ENCOUNTER — Ambulatory Visit (INDEPENDENT_AMBULATORY_CARE_PROVIDER_SITE_OTHER): Payer: Medicare Other | Admitting: Orthopaedic Surgery

## 2019-03-17 DIAGNOSIS — Z96642 Presence of left artificial hip joint: Secondary | ICD-10-CM | POA: Insufficient documentation

## 2019-03-17 NOTE — Progress Notes (Signed)
   Post-Op Visit Note   Patient: Catherine Wong           Date of Birth: [birth year]           MRN: [id number] Visit Date: 03/17/2019 PCP: Patient, No Pcp Per   Assessment & Plan: Post left hip hemiarthroplasty.  Staples removed today.  X-ray showed good position alignment.  She has a fused knee on the opposite right side.  This is fixed with the rod right side short she requested information about building up her right shoe 1/2 inch.  She has long-leg brace left leg drop lock knee.  Chief Complaint:  Chief Complaint  Patient presents with  . Left Hip - Routine Post Op   Visit Diagnoses:  1. History of hemiarthroplasty of left hip     Plan: Staples removed.  Continue weightbearing as tolerated.  Return in 6 weeks no x-ray needed on return.  6 months handicap parking sticker given.  Follow-Up Instructions: Return in about 6 weeks (around 04/28/2019).   Orders:  No orders of the defined types were placed in this encounter.  No orders of the defined types were placed in this encounter.   Imaging: No results found.  PMFS History: Patient Active Problem List   Diagnosis Date Noted  . History of hemiarthroplasty of left hip 03/17/2019  . Late effect acute polio 02/14/2019  . Hip fracture (HCC) 01/21/2019  . Essential hypertension 01/21/2019  . Fall at home, initial encounter 01/21/2019  . Osteoporosis 01/21/2019  . Hypokalemia 01/21/2019   Past Medical History:  Diagnosis Date  . Hypertension   . Osteoporosis   . Rheumatoid arthritis (HCC)     Family History  Problem Relation Age of Onset  . Breast cancer Mother   . CAD Mother   . Leukemia Other     Past Surgical History:  Procedure Laterality Date  . BREAST SURGERY    . HIP ARTHROPLASTY Left 01/22/2019   Procedure: ARTHROPLASTY BIPOLAR HIP (HEMIARTHROPLASTY);  Surgeon: Eldred Manges, MD;  Location: [facility];  Service: Orthopedics;  Laterality: Left;  . LAPAROSCOPY FOR ECTOPIC PREGNANCY     Social History    Occupational History  . Not on file  Tobacco Use  . Smoking status: Current Every Day Smoker  . Smokeless tobacco: Never Used  Substance and Sexual Activity  . Alcohol use: Never  . Drug use: Never  . Sexual activity: Not on file

## 2020-01-12 IMAGING — DX DG HIP (WITH OR WITHOUT PELVIS) 2-3V*L*
3 series · 3 of 3 positions shown · non-contrast
Comparison: None.

CLINICAL DATA: Left hip pain, status post fall

EXAM:
DG HIP (WITH OR WITHOUT PELVIS) 2-3V LEFT

[pelvis ap]
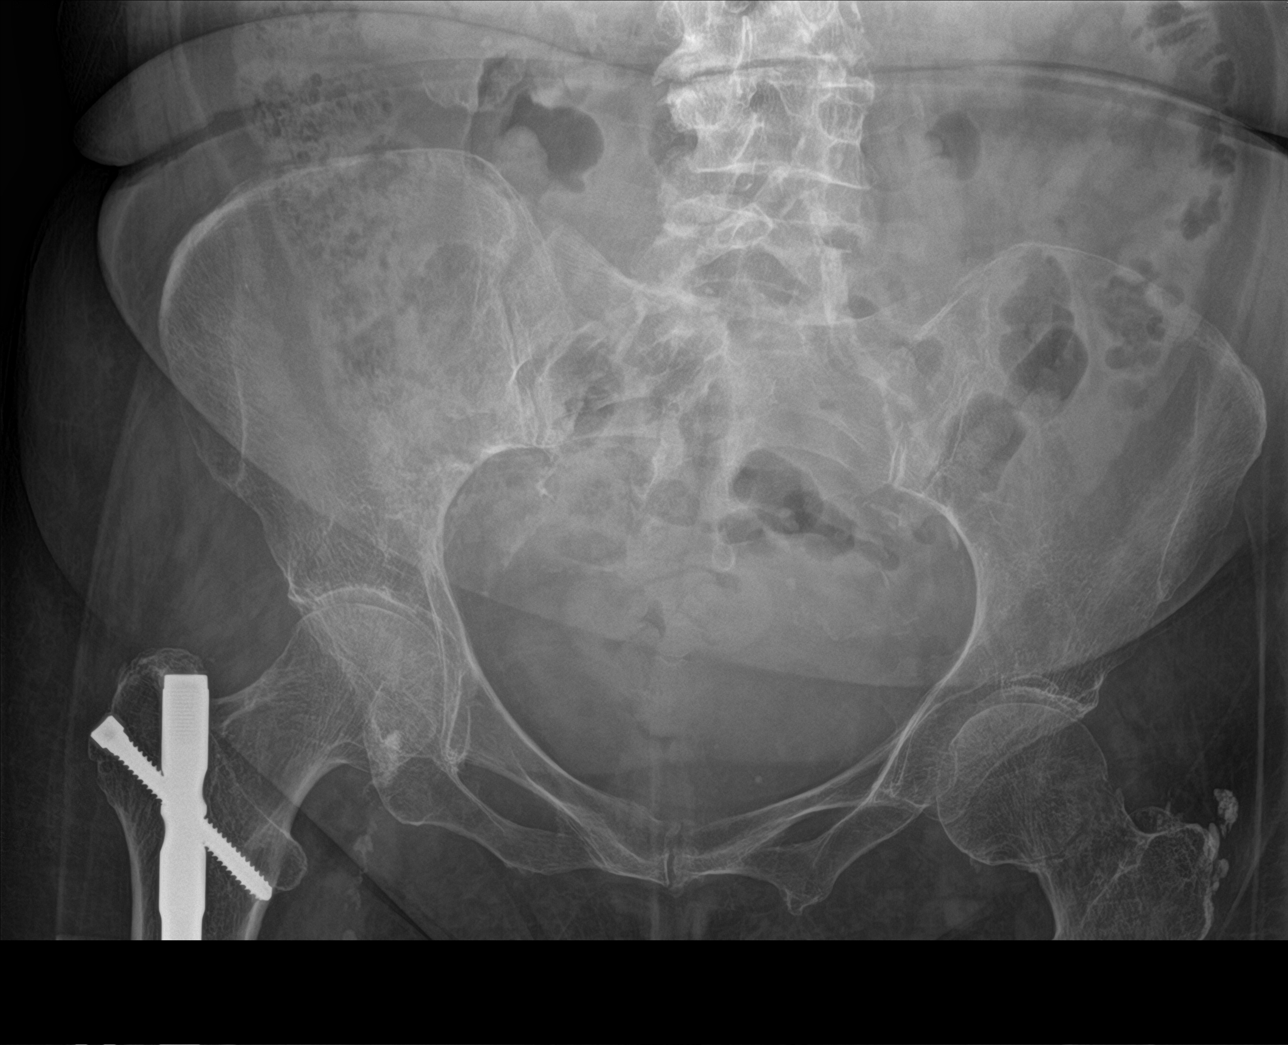

[hip ap]
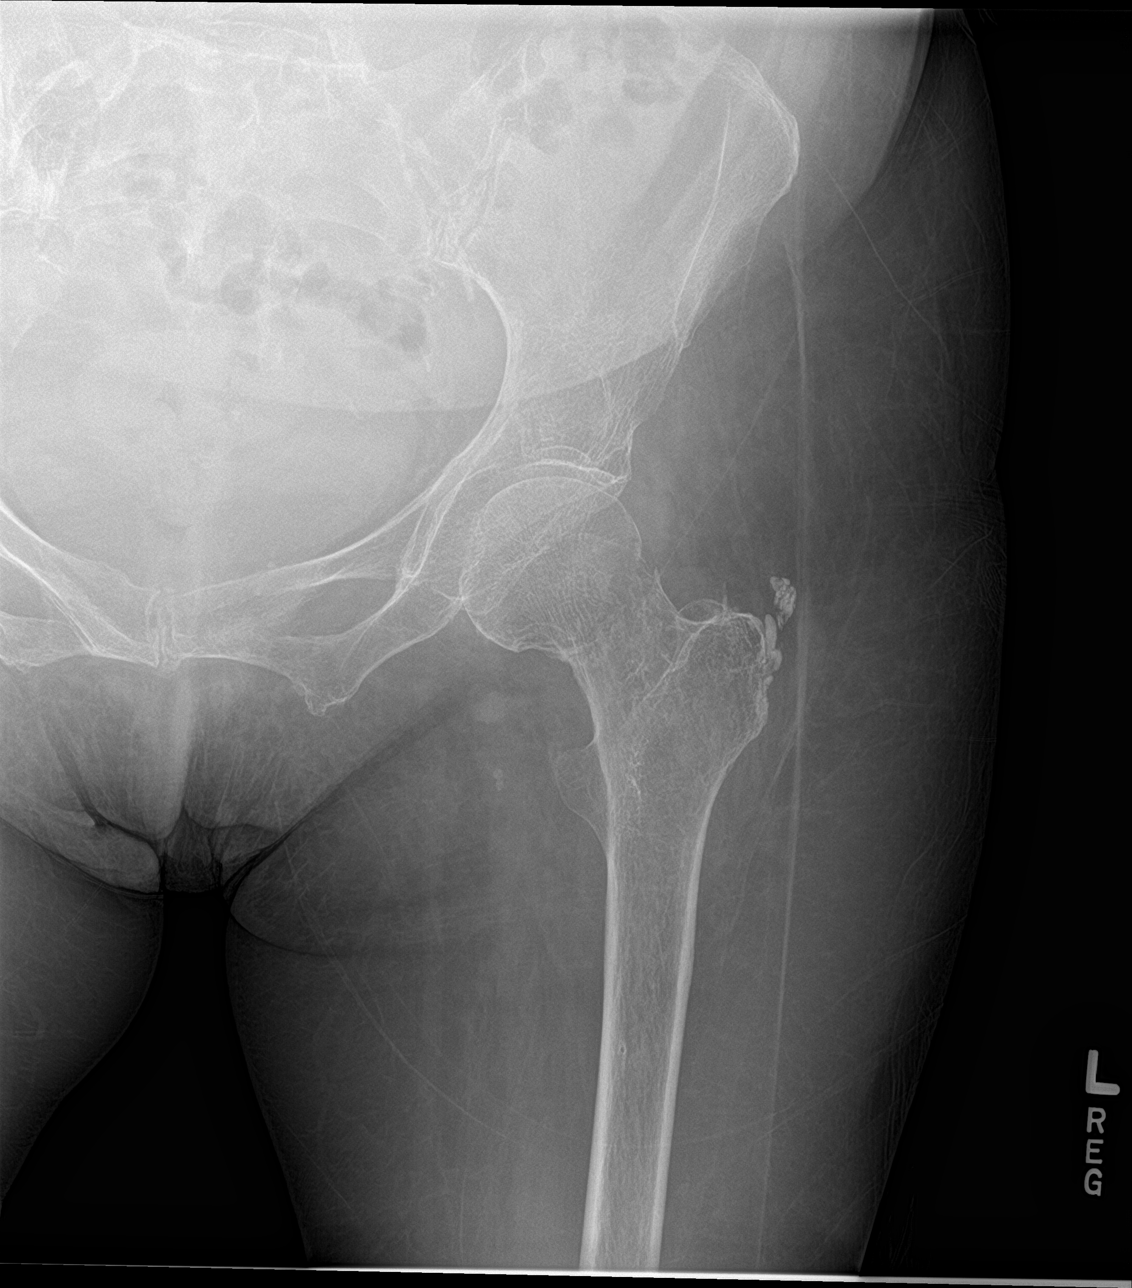

[hip lat]
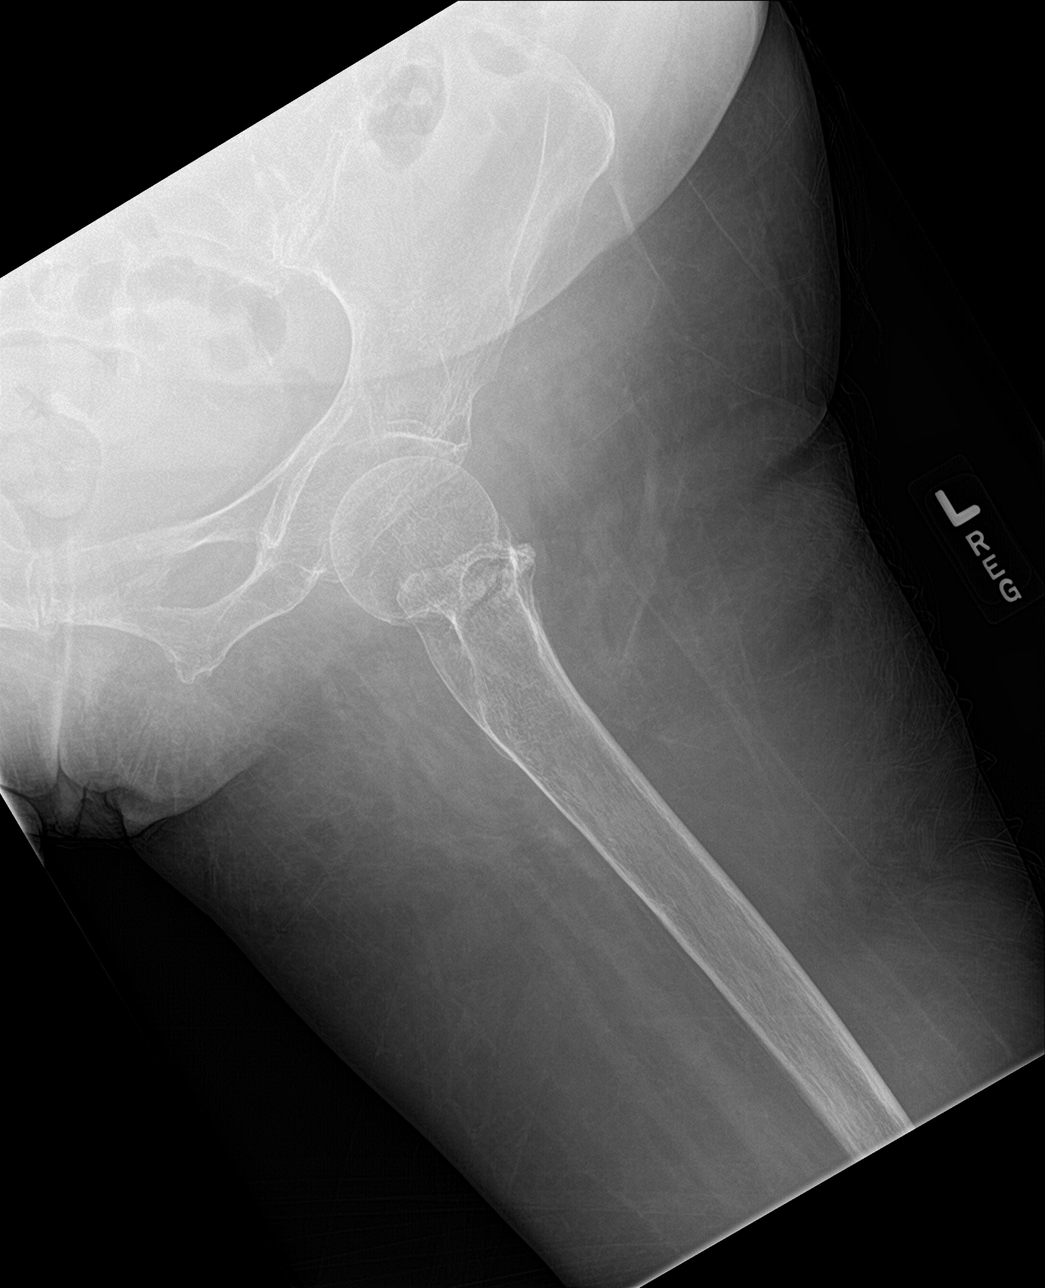

[3 of 3 positions shown; findings below may reference images not displayed]

FINDINGS: Nondisplaced subcapital left hip fracture.

Status post ORIF of the right hip.

Visualized bony pelvis appears intact.

Degenerative changes of the lower lumbar spine.
IMPRESSION: Nondisplaced subcapital left hip fracture.

## 2020-01-15 IMAGING — CT CT ANGIO CHEST
2 of 6 series · 18 of 46 positions shown · IV contrast (APPLIED)
Comparison: None.

CLINICAL DATA: Dyspnea on exertion.

EXAM:
CT ANGIOGRAPHY CHEST WITH CONTRAST
TECHNIQUE: Multidetector CT imaging of the chest was performed using the
standard protocol during bolus administration of intravenous
contrast. Multiplanar CT image reconstructions and MIPs were
obtained to evaluate the vascular anatomy.
CONTRAST:  100mL OMNIPAQUE IOHEXOL 350 MG/ML SOLN

[Series 5: thins · axial · 0.72mm/px · z∈[-220,+28]mm · 16 of 272 slices shown]
[im 12/272  lung]
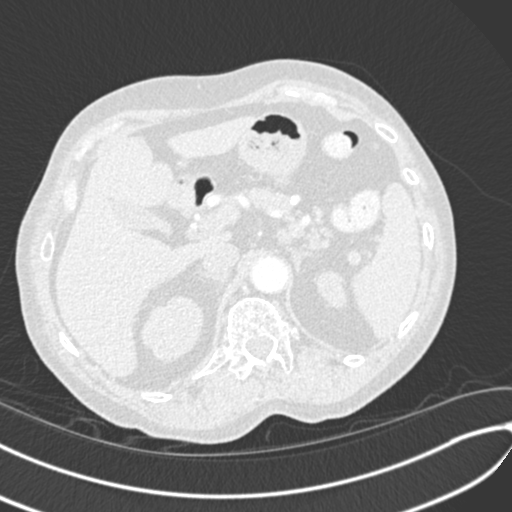
[im 36/272  soft-tissue]
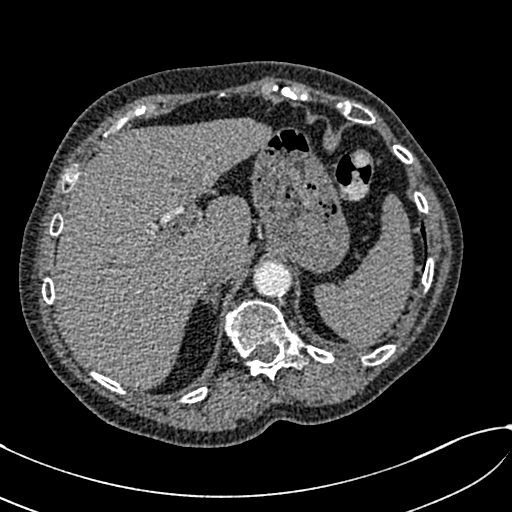
[im 48/272  lung]
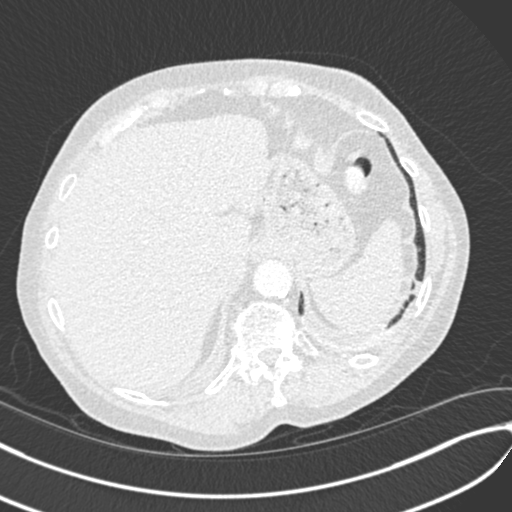
[im 59/272  soft-tissue]
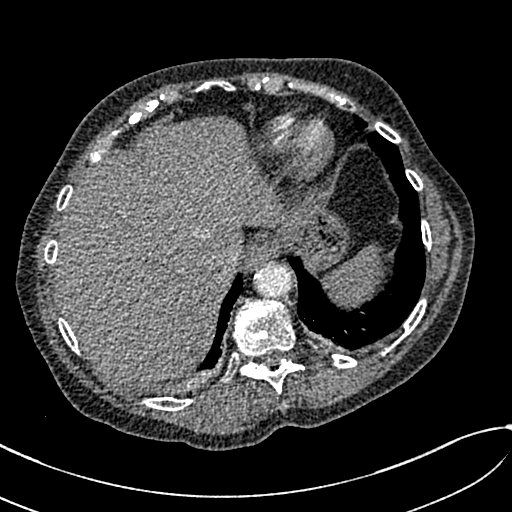
[im 83/272  lung]
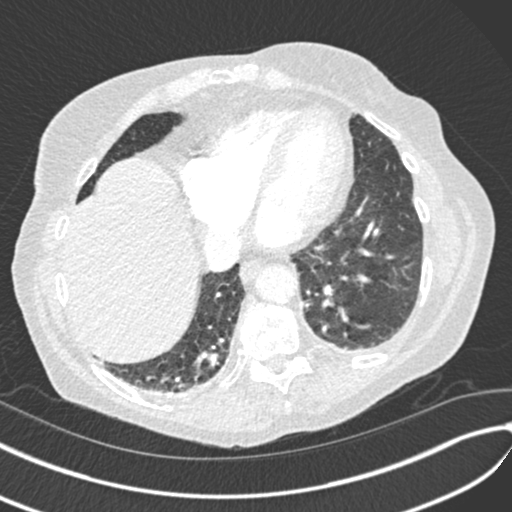
[im 95/272  soft-tissue]
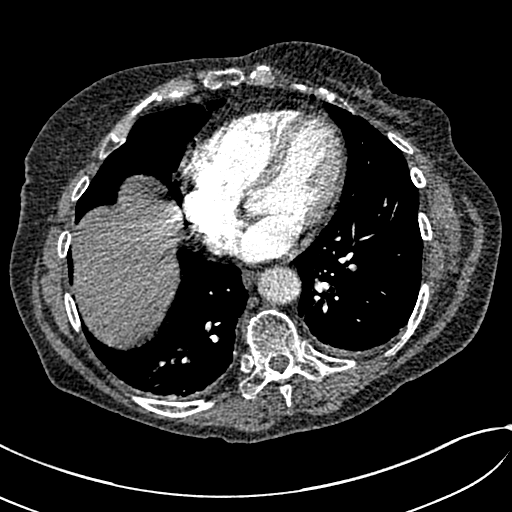
[im 107/272  lung]
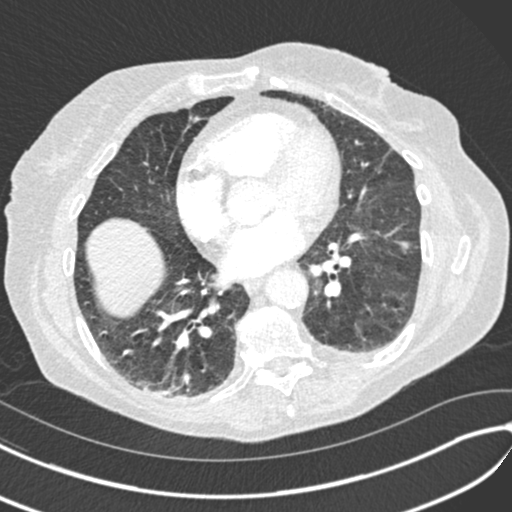
[im 130/272  soft-tissue]
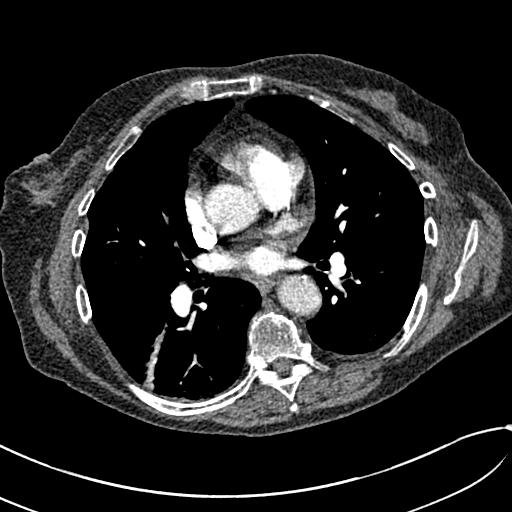
[im 142/272  lung]
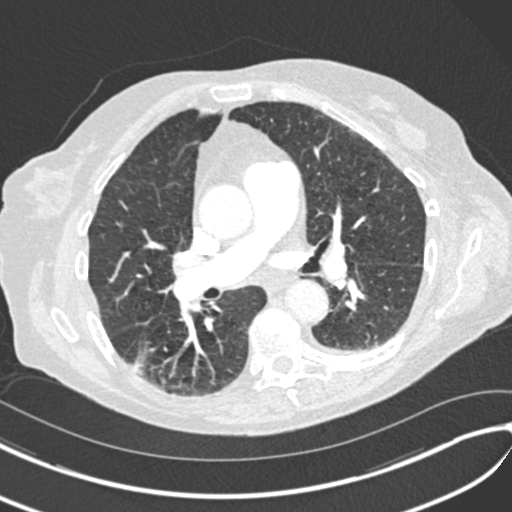
[im 165/272  soft-tissue]
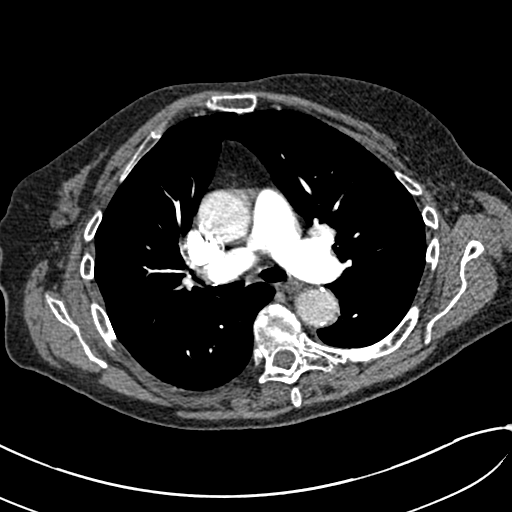
[im 177/272  lung]
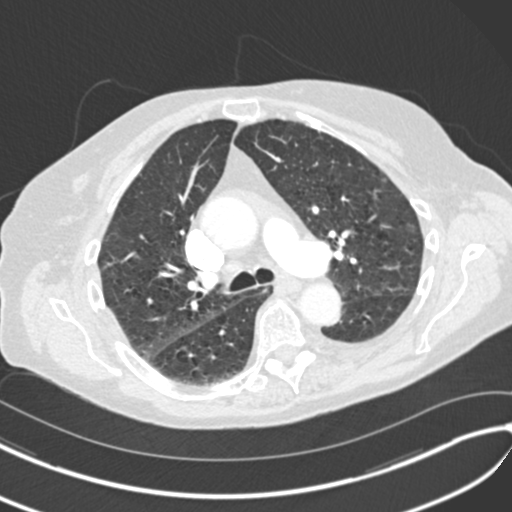
[im 189/272  soft-tissue]
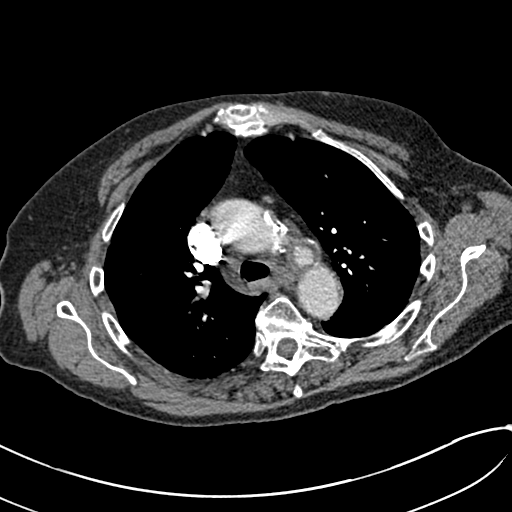
[im 213/272  lung]
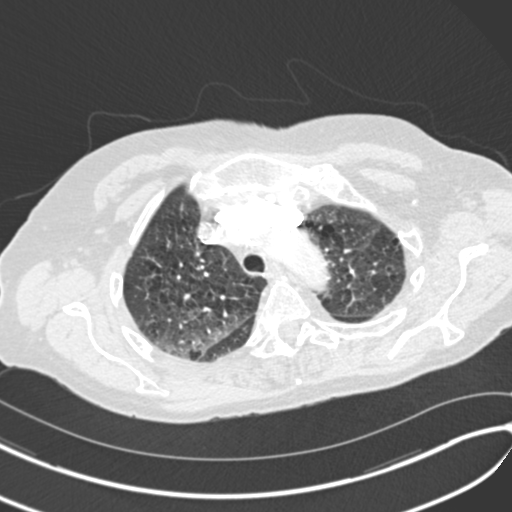
[im 224/272  soft-tissue]
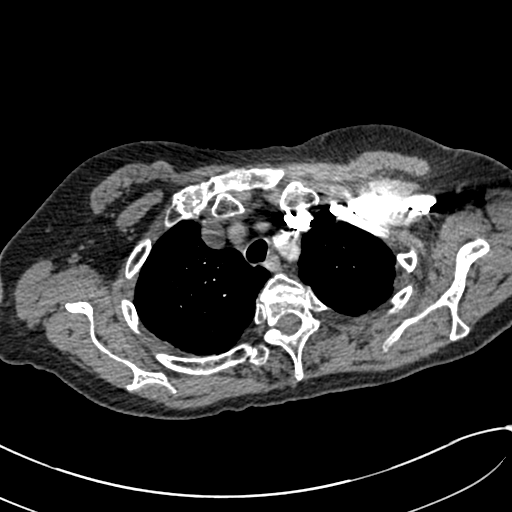
[im 236/272  lung]
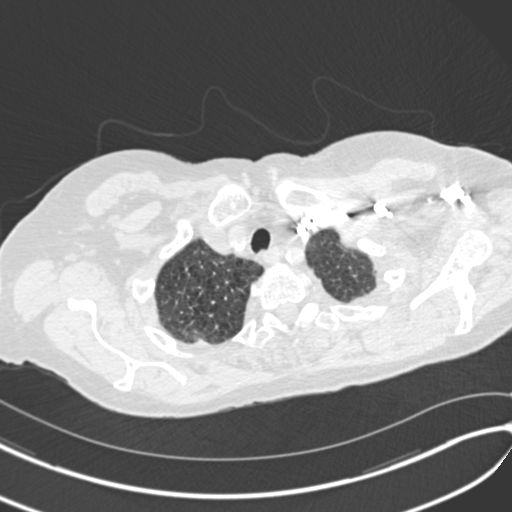
[im 260/272  soft-tissue]
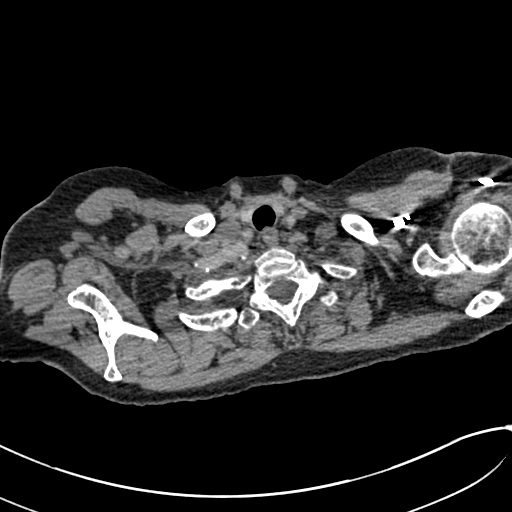

[Series 7: coronal mpr · coronal · 0.55mm/px · 2 of 97 slices shown]
[im 33/97  soft-tissue]
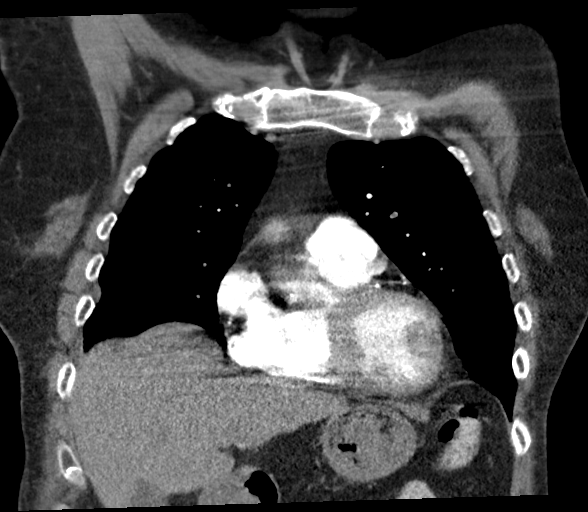
[im 65/97  soft-tissue]
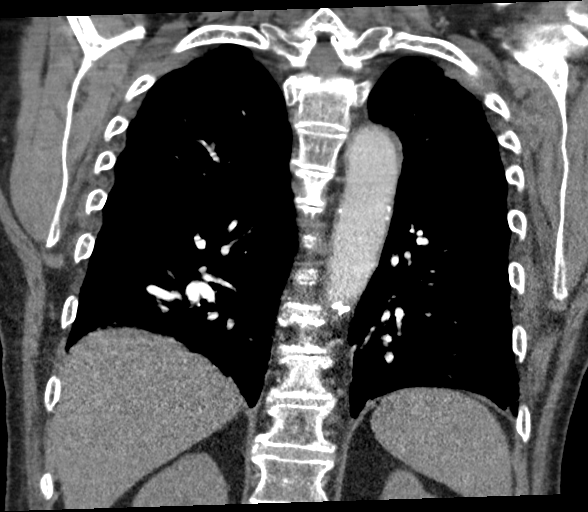

[18 of 46 positions shown; findings below may reference images not displayed]

FINDINGS: Cardiovascular: Linear filling defect is seen extending from upper
lobe branch of right pulmonary artery into lower lobe branch.
Another smaller embolus is noted in lower lobe branch of left
pulmonary artery. RV/LV ratio of 1.2 is noted suggesting possible
right heart strain. Normal cardiac size. No pericardial effusion.
Coronary artery calcifications are noted. Atherosclerosis of
thoracic aorta is noted. Penetrating atherosclerotic ulcer is seen
along inferior aspect of transverse aortic arch measuring 23 x 8 mm.

Mediastinum/Nodes: No enlarged mediastinal, hilar, or axillary lymph
nodes. Thyroid gland, trachea, and esophagus demonstrate no
significant findings.

Lungs/Pleura: No pneumothorax or pleural effusion is noted. Minimal
right posterior basilar subsegmental atelectasis is noted.

Upper Abdomen: No acute abnormality.

Musculoskeletal: No chest wall abnormality. No acute or significant
osseous findings.

Review of the MIP images confirms the above findings.
IMPRESSION: Bilateral pulmonary emboli are noted. Positive for acute PE with CT
evidence of right heart strain (RV/LV Ratio = 1.2) consistent with
at least submassive (intermediate risk) PE. The presence of right
heart strain has been associated with an increased risk of morbidity
and mortality. Please activate Code PE by paging [phone].
Critical Value/emergent results were called by telephone at the time
of interpretation on 01/24/2019 at [DATE] to Yeye Marconi ,
who verbally acknowledged these results.

[DATE] x 0.8 cm penetrating atherosclerotic ulcer is seen along
inferior aspect of transverse aortic arch.

Coronary artery calcifications are noted.

Aortic Atherosclerosis (0E06V-ZEE.E).
